# Patient Record
Sex: Female | Born: 1965 | Race: Black or African American | Hispanic: No | Marital: Single | Smoking: Current every day smoker
Health system: Southern US, Community
[De-identification: ages and names within clinical notes are randomized; demographics above are authoritative.]

## PROBLEM LIST (undated history)

## (undated) DIAGNOSIS — F32A Depression, unspecified: Secondary | ICD-10-CM

## (undated) DIAGNOSIS — F329 Major depressive disorder, single episode, unspecified: Secondary | ICD-10-CM

## (undated) DIAGNOSIS — F419 Anxiety disorder, unspecified: Secondary | ICD-10-CM

## (undated) DIAGNOSIS — N912 Amenorrhea, unspecified: Secondary | ICD-10-CM

## (undated) HISTORY — DX: Anxiety disorder, unspecified: F41.9

## (undated) HISTORY — DX: Major depressive disorder, single episode, unspecified: F32.9

## (undated) HISTORY — DX: Depression, unspecified: F32.A

## (undated) HISTORY — DX: Amenorrhea, unspecified: N91.2

---

## 1993-02-09 HISTORY — PX: OTHER SURGICAL HISTORY: SHX169

## 1994-02-09 HISTORY — PX: HAND SURGERY: SHX662

## 2012-11-30 ENCOUNTER — Encounter: Payer: Self-pay | Admitting: Obstetrics & Gynecology

## 2012-11-30 ENCOUNTER — Other Ambulatory Visit (HOSPITAL_COMMUNITY)
Admission: RE | Admit: 2012-11-30 | Discharge: 2012-11-30 | Disposition: A | Discharge status: Home / Self Care | Payer: Medicaid Other | Source: Ambulatory Visit | Attending: Obstetrics & Gynecology | Admitting: Obstetrics & Gynecology

## 2012-11-30 ENCOUNTER — Ambulatory Visit (INDEPENDENT_AMBULATORY_CARE_PROVIDER_SITE_OTHER): Payer: Medicaid Other | Admitting: Obstetrics & Gynecology

## 2012-11-30 VITALS — BP 117/71 | HR 59 | Temp 96.5°F | Resp 20 | Ht 64.0 in | Wt 160.2 lb

## 2012-11-30 DIAGNOSIS — Z23 Encounter for immunization: Secondary | ICD-10-CM

## 2012-11-30 DIAGNOSIS — Z1151 Encounter for screening for human papillomavirus (HPV): Secondary | ICD-10-CM | POA: Insufficient documentation

## 2012-11-30 DIAGNOSIS — Z01419 Encounter for gynecological examination (general) (routine) without abnormal findings: Secondary | ICD-10-CM

## 2012-11-30 DIAGNOSIS — Z1231 Encounter for screening mammogram for malignant neoplasm of breast: Secondary | ICD-10-CM

## 2012-11-30 DIAGNOSIS — R32 Unspecified urinary incontinence: Secondary | ICD-10-CM

## 2012-11-30 NOTE — Progress Notes (Signed)
  Subjective:     Jenna [name] is a 47 y.o. G0 PMP female and is here for a comprehensive gynecologic physical exam. The patient reports no problems. Underwent menopause at age 89, no PMB. Not sexually active. No other GYN concerns.  Patient reports incontinence for the past six years.  Wears incontinence pads. Always feel the urge to go to the bathroom. No leaking with Valsalva, coughing, sneezing.    History   Social History  . Marital Status: Single    Spouse Name: N/A    Number of Children: N/A  . Years of Education: N/A   Occupational History  . Not on file.   Social History Main Topics  . Smoking status: Current Every Day Smoker -- 0.25 packs/day for 25 years  . Smokeless tobacco: Not on file  . Alcohol Use: 1.2 oz/week    2 Cans of beer per week  . Drug Use: No  . Sexual Activity: No   Other Topics Concern  . Not on file   Social History Narrative  . No narrative on file   No health maintenance topics applied.  The following portions of the patient's history were reviewed and updated as appropriate: allergies, current medications, past family history, past medical history, past social history, past surgical history and problem list.  Last mammogram was 2007.  Review of Systems A comprehensive review of systems was negative.   Objective:   BP 117/71  Pulse 59  Temp(Src) 96.5 F (35.8 C)  Resp 20  Ht 5\' 4"  (1.626 m)  Wt 160 lb 3.2 oz (72.666 kg)  BMI 27.48 kg/m2 GENERAL: Well-developed, well-nourished female in no acute distress.  HEENT: Normocephalic, atraumatic. Sclerae anicteric.  NECK: Supple. Normal thyroid.  LUNGS: Clear to auscultation bilaterally.  HEART: Regular rate and rhythm. BREASTS: Symmetric in size. No masses, skin changes, nipple drainage, or lymphadenopathy. ABDOMEN: Soft, nontender, nondistended. No organomegaly. PELVIC: Normal external female genitalia. Vagina is pink and rugated.  Normal discharge. Normal cervix contour. Pap smear  obtained. Uterus is normal in size. No adnexal mass or tenderness.  EXTREMITIES: No cyanosis, clubbing, or edema, 2+ distal pulses.   Assessment:   Healthy female exam.  Incontinence, likely urge Desires flu vaccine   Plan:  Pap done, will follow up results and manage accordingly.  Flu vaccine given. Declines trial of Ditropan or referral to Urology. Basic information given to help with incontinence Mammogram scheduled Routine preventative health maintenance measures emphasized.  Jaynie Collins, MD, FACOG Attending Obstetrician & Gynecologist Faculty Practice, [facility]

## 2012-11-30 NOTE — Patient Instructions (Addendum)
Urinary Incontinence Your doctor wants you to have this information about urinary incontinence. This is the inability to keep urine in your body until you decide to release it. CAUSES  Prostate gland enlargement is a common cause of urinary incontinence. But there are many different causes for losing urinary control. They include:  Medicines.  Infections.  Prostate problems.  Surgery.  Neurological diseases.  Emotional factors. DIAGNOSIS  Evaluating the cause of incontinence is important in choosing the best treatment. This may require:  An ultrasound exam.  Kidney and bladder X-rays.  Cystoscopy. This is an exam of the bladder using a narrow scope. TREATMENT  For incontinent patients, normal daily hygiene and using changing pads or adult diapers regularly will prevent offensive odors and skin damage from the moisture. Changing your medicines may help control incontinence. Your caregiver may prescribe some medicines to help you regain control. Avoid caffeine. It can over-stimulate the bladder. Use the bathroom regularly. Try about every 2 to 3 hours even if you do not feel the need. Take time to empty your bladder completely. After urinating, wait a minute. Then try to urinate again. External devices used to catch urine or an indwelling urine catheter (Foley catheter) may be needed as well. Some prostate gland problems require surgery to correct. Call your caregiver for more information. Document Released: 03/05/2004 Document Revised: 04/20/2011 Document Reviewed: 02/29/2008 [facility] Patient Information 2014 [place], [place].  Preventive Care for Adults, Female A healthy lifestyle and preventive care can promote health and wellness. Preventive health guidelines for women include the following key practices.  A routine yearly physical is a good way to check with your caregiver about your health and preventive screening. It is a chance to share any concerns and updates on your health,  and to receive a thorough exam.  Visit your dentist for a routine exam and preventive care every 6 months. Brush your teeth twice a day and floss once a day. Good oral hygiene prevents tooth decay and gum disease.  The frequency of eye exams is based on your age, health, family medical history, use of contact lenses, and other factors. Follow your caregiver's recommendations for frequency of eye exams.  Eat a healthy diet. Foods like vegetables, fruits, whole grains, low-fat dairy products, and lean protein foods contain the nutrients you need without too many calories. Decrease your intake of foods high in solid fats, added sugars, and salt. Eat the right amount of calories for you.Get information about a proper diet from your caregiver, if necessary.  Regular physical exercise is one of the most important things you can do for your health. Most adults should get at least 150 minutes of moderate-intensity exercise (any activity that increases your heart rate and causes you to sweat) each week. In addition, most adults need muscle-strengthening exercises on 2 or more days a week.  Maintain a healthy weight. The body mass index (BMI) is a screening tool to identify possible weight problems. It provides an estimate of body fat based on height and weight. Your caregiver can help determine your BMI, and can help you achieve or maintain a healthy weight.For adults 20 years and older:  A BMI below 18.5 is considered underweight.  A BMI of 18.5 to 24.9 is normal.  A BMI of 25 to 29.9 is considered overweight.  A BMI of 30 and above is considered obese.  Maintain normal blood lipids and cholesterol levels by exercising and minimizing your intake of saturated fat. Eat a balanced diet with plenty  of fruit and vegetables. Blood tests for lipids and cholesterol should begin at age 91 and be repeated every 5 years. If your lipid or cholesterol levels are high, you are over 50, or you are at high risk for  heart disease, you may need your cholesterol levels checked more frequently.Ongoing high lipid and cholesterol levels should be treated with medicines if diet and exercise are not effective.  If you smoke, find out from your caregiver how to quit. If you do not use tobacco, do not start.  If you are pregnant, do not drink alcohol. If you are breastfeeding, be very cautious about drinking alcohol. If you are not pregnant and choose to drink alcohol, do not exceed 1 drink per day. One drink is considered to be 12 ounces (355 mL) of beer, 5 ounces (148 mL) of wine, or 1.5 ounces (44 mL) of liquor.  Avoid use of street drugs. Do not share needles with anyone. Ask for help if you need support or instructions about stopping the use of drugs.  High blood pressure causes heart disease and increases the risk of stroke. Your blood pressure should be checked at least every 1 to 2 years. Ongoing high blood pressure should be treated with medicines if weight loss and exercise are not effective.  If you are 66 to 47 years old, ask your caregiver if you should take aspirin to prevent strokes.  Diabetes screening involves taking a blood sample to check your fasting blood sugar level. This should be done once every 3 years, after age 46, if you are within normal weight and without risk factors for diabetes. Testing should be considered at a younger age or be carried out more frequently if you are overweight and have at least 1 risk factor for diabetes.  Breast cancer screening is essential preventive care for women. You should practice "breast self-awareness." This means understanding the normal appearance and feel of your breasts and may include breast self-examination. Any changes detected, no matter how small, should be reported to a caregiver. Women in their 28s and 30s should have a clinical breast exam (CBE) by a caregiver as part of a regular health exam every 1 to 3 years. After age 88, women should have a CBE  every year. Starting at age 64, women should consider having a mammography (breast X-ray test) every year. Women who have a family history of breast cancer should talk to their caregiver about genetic screening. Women at a high risk of breast cancer should talk to their caregivers about having magnetic resonance imaging (MRI) and a mammography every year.  The Pap test is a screening test for cervical cancer. A Pap test can show cell changes on the cervix that might become cervical cancer if left untreated. A Pap test is a procedure in which cells are obtained and examined from the lower end of the uterus (cervix).  Women should have a Pap test starting at age 38.  Between ages 108 and 107, Pap tests should be repeated every 2 years.  Beginning at age 14, you should have a Pap test every 3 years as long as the past 3 Pap tests have been normal.  Some women have medical problems that increase the chance of getting cervical cancer. Talk to your caregiver about these problems. It is especially important to talk to your caregiver if a new problem develops soon after your last Pap test. In these cases, your caregiver may recommend more frequent screening and Pap tests.  The above recommendations are the same for women who have or have not gotten the vaccine for human papillomavirus (HPV).  If you had a hysterectomy for a problem that was not cancer or a condition that could lead to cancer, then you no longer need Pap tests. Even if you no longer need a Pap test, a regular exam is a good idea to make sure no other problems are starting.  If you are between ages 77 and 50, and you have had normal Pap tests going back 10 years, you no longer need Pap tests. Even if you no longer need a Pap test, a regular exam is a good idea to make sure no other problems are starting.  If you have had past treatment for cervical cancer or a condition that could lead to cancer, you need Pap tests and screening for cancer for  at least 20 years after your treatment.  If Pap tests have been discontinued, risk factors (such as a new sexual partner) need to be reassessed to determine if screening should be resumed.  The HPV test is an additional test that may be used for cervical cancer screening. The HPV test looks for the virus that can cause the cell changes on the cervix. The cells collected during the Pap test can be tested for HPV. The HPV test could be used to screen women aged 44 years and older, and should be used in women of any age who have unclear Pap test results. After the age of 38, women should have HPV testing at the same frequency as a Pap test.  Colorectal cancer can be detected and often prevented. Most routine colorectal cancer screening begins at the age of 43 and continues through age 66. However, your caregiver may recommend screening at an earlier age if you have risk factors for colon cancer. On a yearly basis, your caregiver may provide home test kits to check for hidden blood in the stool. Use of a small camera at the end of a tube, to directly examine the colon (sigmoidoscopy or colonoscopy), can detect the earliest forms of colorectal cancer. Talk to your caregiver about this at age 77, when routine screening begins. Direct examination of the colon should be repeated every 5 to 10 years through age 34, unless early forms of pre-cancerous polyps or small growths are found.  Hepatitis C blood testing is recommended for all people born from [birth year] through 1965 and any individual with known risks for hepatitis C.  Practice safe sex. Use condoms and avoid high-risk sexual practices to reduce the spread of sexually transmitted infections (STIs). STIs include gonorrhea, chlamydia, syphilis, trichomonas, herpes, HPV, and human immunodeficiency virus (HIV). Herpes, HIV, and HPV are viral illnesses that have no cure. They can result in disability, cancer, and death. Sexually active women aged 55 and younger  should be checked for chlamydia. Older women with new or multiple partners should also be tested for chlamydia. Testing for other STIs is recommended if you are sexually active and at increased risk.  Osteoporosis is a disease in which the bones lose minerals and strength with aging. This can result in serious bone fractures. The risk of osteoporosis can be identified using a bone density scan. Women ages 70 and over and women at risk for fractures or osteoporosis should discuss screening with their caregivers. Ask your caregiver whether you should take a calcium supplement or vitamin D to reduce the rate of osteoporosis.  Menopause can be associated with physical symptoms  and risks. Hormone replacement therapy is available to decrease symptoms and risks. You should talk to your caregiver about whether hormone replacement therapy is right for you.  Use sunscreen with sun protection factor (SPF) of 30 or more. Apply sunscreen liberally and repeatedly throughout the day. You should seek shade when your shadow is shorter than you. Protect yourself by wearing long sleeves, pants, a wide-brimmed hat, and sunglasses year round, whenever you are outdoors.  Once a month, do a whole body skin exam, using a mirror to look at the skin on your back. Notify your caregiver of new moles, moles that have irregular borders, moles that are larger than a pencil eraser, or moles that have changed in shape or color.  Stay current with required immunizations.  Influenza. You need a dose every fall (or winter). The composition of the flu vaccine changes each year, so being vaccinated once is not enough.  Pneumococcal polysaccharide. You need 1 to 2 doses if you smoke cigarettes or if you have certain chronic medical conditions. You need 1 dose at age 65 (or older) if you have never been vaccinated.  Tetanus, diphtheria, pertussis (Tdap, Td). Get 1 dose of Tdap vaccine if you are younger than age 47, are over 69 and have  contact with an infant, are a Research scientist (physical sciences), are pregnant, or simply want to be protected from whooping cough. After that, you need a Td booster dose every 10 years. Consult your caregiver if you have not had at least 3 tetanus and diphtheria-containing shots sometime in your life or have a deep or dirty wound.  HPV. You need this vaccine if you are a woman age 25 or younger. The vaccine is given in 3 doses over 6 months.  Measles, mumps, rubella (MMR). You need at least 1 dose of MMR if you were born in 1957 or later. You may also need a second dose.  Meningococcal. If you are age 65 to 27 and a first-year college student living in a residence hall, or have one of several medical conditions, you need to get vaccinated against meningococcal disease. You may also need additional booster doses.  Zoster (shingles). If you are age 76 or older, you should get this vaccine.  Varicella (chickenpox). If you have never had chickenpox or you were vaccinated but received only 1 dose, talk to your caregiver to find out if you need this vaccine.  Hepatitis A. You need this vaccine if you have a specific risk factor for hepatitis A virus infection or you simply wish to be protected from this disease. The vaccine is usually given as 2 doses, 6 to 18 months apart.  Hepatitis B. You need this vaccine if you have a specific risk factor for hepatitis B virus infection or you simply wish to be protected from this disease. The vaccine is given in 3 doses, usually over 6 months. Preventive Services / Frequency Ages 14 to 95  Blood pressure check.** / Every 1 to 2 years.  Lipid and cholesterol check.** / Every 5 years beginning at age 7.  Clinical breast exam.** / Every 3 years for women in their 34s and 30s.  Pap test.** / Every 2 years from ages 63 through 75. Every 3 years starting at age 44 through age 54 or 55 with a history of 3 consecutive normal Pap tests.  HPV screening.** / Every 3 years from ages  27 through ages 68 to 21 with a history of 3 consecutive normal Pap tests.  Hepatitis C blood test.** / For any individual with known risks for hepatitis C.  Skin self-exam. / Monthly.  Influenza immunization.** / Every year.  Pneumococcal polysaccharide immunization.** / 1 to 2 doses if you smoke cigarettes or if you have certain chronic medical conditions.  Tetanus, diphtheria, pertussis (Tdap, Td) immunization. / A one-time dose of Tdap vaccine. After that, you need a Td booster dose every 10 years.  HPV immunization. / 3 doses over 6 months, if you are 30 and younger.  Measles, mumps, rubella (MMR) immunization. / You need at least 1 dose of MMR if you were born in 1957 or later. You may also need a second dose.  Meningococcal immunization. / 1 dose if you are age 12 to 33 and a first-year college student living in a residence hall, or have one of several medical conditions, you need to get vaccinated against meningococcal disease. You may also need additional booster doses.  Varicella immunization.** / Consult your caregiver.  Hepatitis A immunization.** / Consult your caregiver. 2 doses, 6 to 18 months apart.  Hepatitis B immunization.** / Consult your caregiver. 3 doses usually over 6 months. Ages 41 to 22  Blood pressure check.** / Every 1 to 2 years.  Lipid and cholesterol check.** / Every 5 years beginning at age 26.  Clinical breast exam.** / Every year after age 22.  Mammogram.** / Every year beginning at age 72 and continuing for as long as you are in good health. Consult with your caregiver.  Pap test.** / Every 3 years starting at age 58 through age 60 or 46 with a history of 3 consecutive normal Pap tests.  HPV screening.** / Every 3 years from ages 55 through ages 74 to 51 with a history of 3 consecutive normal Pap tests.  Fecal occult blood test (FOBT) of stool. / Every year beginning at age 109 and continuing until age 52. You may not need to do this test if you  get a colonoscopy every 10 years.  Flexible sigmoidoscopy or colonoscopy.** / Every 5 years for a flexible sigmoidoscopy or every 10 years for a colonoscopy beginning at age 41 and continuing until age 29.  Hepatitis C blood test.** / For all people born from [birth year] through 1965 and any individual with known risks for hepatitis C.  Skin self-exam. / Monthly.  Influenza immunization.** / Every year.  Pneumococcal polysaccharide immunization.** / 1 to 2 doses if you smoke cigarettes or if you have certain chronic medical conditions.  Tetanus, diphtheria, pertussis (Tdap, Td) immunization.** / A one-time dose of Tdap vaccine. After that, you need a Td booster dose every 10 years.  Measles, mumps, rubella (MMR) immunization. / You need at least 1 dose of MMR if you were born in 1957 or later. You may also need a second dose.  Varicella immunization.** / Consult your caregiver.  Meningococcal immunization.** / Consult your caregiver.  Hepatitis A immunization.** / Consult your caregiver. 2 doses, 6 to 18 months apart.  Hepatitis B immunization.** / Consult your caregiver. 3 doses, usually over 6 months. Ages 13 and over  Blood pressure check.** / Every 1 to 2 years.  Lipid and cholesterol check.** / Every 5 years beginning at age 73.  Clinical breast exam.** / Every year after age 71.  Mammogram.** / Every year beginning at age 46 and continuing for as long as you are in good health. Consult with your caregiver.  Pap test.** / Every 3 years starting at age 52 through age  65 or 70 with a 3 consecutive normal Pap tests. Testing can be stopped between 65 and 70 with 3 consecutive normal Pap tests and no abnormal Pap or HPV tests in the past 10 years.  HPV screening.** / Every 3 years from ages 9 through ages 48 or 12 with a history of 3 consecutive normal Pap tests. Testing can be stopped between 65 and 70 with 3 consecutive normal Pap tests and no abnormal Pap or HPV tests in the past 10  years.  Fecal occult blood test (FOBT) of stool. / Every year beginning at age 51 and continuing until age 27. You may not need to do this test if you get a colonoscopy every 10 years.  Flexible sigmoidoscopy or colonoscopy.** / Every 5 years for a flexible sigmoidoscopy or every 10 years for a colonoscopy beginning at age 35 and continuing until age 45.  Hepatitis C blood test.** / For all people born from [birth year] through 1965 and any individual with known risks for hepatitis C.  Osteoporosis screening.** / A one-time screening for women ages 55 and over and women at risk for fractures or osteoporosis.  Skin self-exam. / Monthly.  Influenza immunization.** / Every year.  Pneumococcal polysaccharide immunization.** / 1 dose at age 72 (or older) if you have never been vaccinated.  Tetanus, diphtheria, pertussis (Tdap, Td) immunization. / A one-time dose of Tdap vaccine if you are over 65 and have contact with an infant, are a Research scientist (physical sciences), or simply want to be protected from whooping cough. After that, you need a Td booster dose every 10 years.  Varicella immunization.** / Consult your caregiver.  Meningococcal immunization.** / Consult your caregiver.  Hepatitis A immunization.** / Consult your caregiver. 2 doses, 6 to 18 months apart.  Hepatitis B immunization.** / Check with your caregiver. 3 doses, usually over 6 months. ** Family history and personal history of risk and conditions may change your caregiver's recommendations. Document Released: 03/24/2001 Document Revised: 04/20/2011 Document Reviewed: 06/23/2010 [facility] Patient Information 2014 [place], [place].  Thank you for enrolling in MyChart. Please follow the instructions below to securely access your online medical record. MyChart allows you to send messages to your doctor, view your test results, manage appointments, and more.   How Do I Sign Up? 1. In your Internet browser, go to [employer] and enter  [email or web address]. 2. Click on the Sign Up Now link in the Sign In box. You will see the New Member Sign Up page. 3. Enter your MyChart Access Code exactly as it appears below. You will not need to use this code after you've completed the sign-up process. If you do not sign up before the expiration date, you must request a new code.  MyChart Access Code: [email or web address] Expires: 01/29/2013  3:20 PM  4. Enter your Social Security Number ([id number]) and Date of Birth (mm/dd/yyyy) as indicated and click Submit. You will be taken to the next sign-up page. 5. Create a MyChart ID. This will be your MyChart login ID and cannot be changed, so think of one that is secure and easy to remember. 6. Create a MyChart password. You can change your password at any time. 7. Enter your Password Reset Question and Answer. This can be used at a later time if you forget your password.  8. Enter your e-mail address. You will receive e-mail notification when new information is available in MyChart. 9. Click Sign Up. You can now view your medical record.   Additional Information Remember,  MyChart is NOT to be used for urgent needs. For medical emergencies, dial 911.

## 2012-11-30 NOTE — Progress Notes (Signed)
Pt here for annual Pap- does not remember when last Pap done, ?? 2007,  She states she has not had menstrual period x6 years

## 2012-12-01 ENCOUNTER — Encounter: Payer: Self-pay | Admitting: *Deleted

## 2012-12-13 ENCOUNTER — Ambulatory Visit (HOSPITAL_COMMUNITY)
Admission: RE | Admit: 2012-12-13 | Discharge: 2012-12-13 | Disposition: A | Discharge status: Home / Self Care | Payer: Medicaid Other | Source: Ambulatory Visit | Attending: Obstetrics & Gynecology | Admitting: Obstetrics & Gynecology

## 2012-12-13 DIAGNOSIS — Z01419 Encounter for gynecological examination (general) (routine) without abnormal findings: Secondary | ICD-10-CM

## 2012-12-13 DIAGNOSIS — Z1231 Encounter for screening mammogram for malignant neoplasm of breast: Secondary | ICD-10-CM

## 2012-12-14 ENCOUNTER — Telehealth: Payer: Self-pay | Admitting: *Deleted

## 2012-12-14 DIAGNOSIS — N6320 Unspecified lump in the left breast, unspecified quadrant: Secondary | ICD-10-CM

## 2012-12-14 NOTE — Telephone Encounter (Signed)
Message copied by Dorothyann Peng on Wed Dec 14, 2012  2:33 PM ------      Message from: Jenna [name] A      Created: Wed Dec 14, 2012  1:03 PM       Please call patient to ensure she has follow up for this possible left breast mass; no follow up plans were specified in the results.  She needs diagnostic mammogram and possibly ultrasound of the left breast.       ------

## 2012-12-14 NOTE — Telephone Encounter (Signed)
Called pt, voicemail, left message for pt to call clinic.

## 2012-12-15 ENCOUNTER — Other Ambulatory Visit: Payer: Self-pay | Admitting: Obstetrics & Gynecology

## 2012-12-15 DIAGNOSIS — N6320 Unspecified lump in the left breast, unspecified quadrant: Secondary | ICD-10-CM

## 2012-12-15 NOTE — Telephone Encounter (Signed)
Spoke with patient about need for additional testing, number to breast center given.

## 2012-12-15 NOTE — Telephone Encounter (Signed)
Pt called nurse line for results. 

## 2012-12-15 NOTE — Telephone Encounter (Signed)
Left message on sister's voicemail for pt to call clinic.

## 2013-01-04 ENCOUNTER — Other Ambulatory Visit: Payer: Self-pay | Admitting: Obstetrics & Gynecology

## 2013-01-04 ENCOUNTER — Ambulatory Visit
Admission: RE | Admit: 2013-01-04 | Discharge: 2013-01-04 | Disposition: A | Discharge status: Home / Self Care | Payer: Medicaid Other | Source: Ambulatory Visit | Attending: Obstetrics & Gynecology | Admitting: Obstetrics & Gynecology

## 2013-01-04 DIAGNOSIS — N6320 Unspecified lump in the left breast, unspecified quadrant: Secondary | ICD-10-CM

## 2013-01-09 ENCOUNTER — Ambulatory Visit: Admission: RE | Admit: 2013-01-09 | Payer: Medicaid Other | Source: Ambulatory Visit

## 2013-01-09 ENCOUNTER — Inpatient Hospital Stay: Admission: RE | Admit: 2013-01-09 | Payer: Medicaid Other | Source: Ambulatory Visit

## 2013-02-27 ENCOUNTER — Ambulatory Visit
Admission: RE | Admit: 2013-02-27 | Discharge: 2013-02-27 | Disposition: A | Discharge status: Home / Self Care | Payer: Medicaid Other | Source: Ambulatory Visit

## 2013-02-27 ENCOUNTER — Other Ambulatory Visit: Payer: Self-pay | Admitting: Obstetrics & Gynecology

## 2013-02-27 ENCOUNTER — Ambulatory Visit: Admission: RE | Admit: 2013-02-27 | Payer: Medicaid Other | Source: Ambulatory Visit

## 2013-02-27 DIAGNOSIS — N6320 Unspecified lump in the left breast, unspecified quadrant: Secondary | ICD-10-CM

## 2013-03-13 ENCOUNTER — Ambulatory Visit (INDEPENDENT_AMBULATORY_CARE_PROVIDER_SITE_OTHER): Payer: Medicaid Other | Admitting: General Surgery

## 2013-05-17 ENCOUNTER — Ambulatory Visit: Payer: Medicaid Other | Attending: Internal Medicine | Admitting: Internal Medicine

## 2013-05-17 VITALS — BP 109/70 | HR 62 | Temp 97.5°F | Resp 16 | Ht 65.0 in | Wt 163.0 lb

## 2013-05-17 DIAGNOSIS — I10 Essential (primary) hypertension: Secondary | ICD-10-CM | POA: Insufficient documentation

## 2013-05-17 LAB — CBC WITH DIFFERENTIAL/PLATELET
BASOS ABS: 0 10*3/uL (ref 0.0–0.1)
BASOS PCT: 0 % (ref 0–1)
Eosinophils Absolute: 0.2 10*3/uL (ref 0.0–0.7)
Eosinophils Relative: 2 % (ref 0–5)
HCT: 39.3 % (ref 36.0–46.0)
Hemoglobin: 12.3 g/dL (ref 12.0–15.0)
Lymphocytes Relative: 42 % (ref 12–46)
Lymphs Abs: 4.1 10*3/uL — ABNORMAL HIGH (ref 0.7–4.0)
MCH: 22 pg — ABNORMAL LOW (ref 26.0–34.0)
MCHC: 31.3 g/dL (ref 30.0–36.0)
MCV: 70.3 fL — AB (ref 78.0–100.0)
MONO ABS: 0.7 10*3/uL (ref 0.1–1.0)
Monocytes Relative: 7 % (ref 3–12)
Neutro Abs: 4.8 10*3/uL (ref 1.7–7.7)
Neutrophils Relative %: 49 % (ref 43–77)
Platelets: 296 10*3/uL (ref 150–400)
RBC: 5.59 MIL/uL — ABNORMAL HIGH (ref 3.87–5.11)
RDW: 15.5 % (ref 11.5–15.5)
WBC: 9.7 10*3/uL (ref 4.0–10.5)

## 2013-05-17 LAB — COMPREHENSIVE METABOLIC PANEL
ALK PHOS: 72 U/L (ref 39–117)
ALT: 15 U/L (ref 0–35)
AST: 14 U/L (ref 0–37)
Albumin: 3.9 g/dL (ref 3.5–5.2)
BILIRUBIN TOTAL: 0.4 mg/dL (ref 0.2–1.2)
BUN: 13 mg/dL (ref 6–23)
CO2: 27 mEq/L (ref 19–32)
Calcium: 9.8 mg/dL (ref 8.4–10.5)
Chloride: 104 mEq/L (ref 96–112)
Creat: 0.78 mg/dL (ref 0.50–1.10)
Glucose, Bld: 85 mg/dL (ref 70–99)
Potassium: 4.7 mEq/L (ref 3.5–5.3)
Sodium: 139 mEq/L (ref 135–145)
Total Protein: 6.5 g/dL (ref 6.0–8.3)

## 2013-05-17 LAB — LIPID PANEL
CHOL/HDL RATIO: 3.4 ratio
Cholesterol: 139 mg/dL (ref 0–200)
HDL: 41 mg/dL (ref >39)
LDL CALC: 77 mg/dL (ref 0–99)
Triglycerides: 103 mg/dL (ref <150)
VLDL: 21 mg/dL (ref 0–40)

## 2013-05-17 LAB — HEMOGLOBIN A1C
HEMOGLOBIN A1C: 6.2 % — AB (ref <5.7)
Mean Plasma Glucose: 131 mg/dL — ABNORMAL HIGH (ref <117)

## 2013-05-17 NOTE — Progress Notes (Signed)
Patient ID: Jenna [name], female   DOB: August 12, 1965, 48 y.o.   MRN: [id number]   CC: follow up  HPI: Pt is 48 yo female who presents to clinic for follow up. She denies chest pain, shortness of breath, no abdominal or urinary concerns.   Allergies  Allergen Reactions  . Penicillins Rash  . Sulfa Antibiotics Rash   Past Medical History  Diagnosis Date  . Depression   . Anxiety   . Amenorrhea    Current Outpatient Prescriptions on File Prior to Visit  Medication Sig Dispense Refill  . ARIPiprazole (ABILIFY) 5 MG tablet Take 5 mg by mouth daily.      Marland Kitchen FLUoxetine (PROZAC) 20 MG tablet Take 20 mg by mouth daily.       No current facility-administered medications on file prior to visit.   Family History  Problem Relation Age of Onset  . Cancer Mother     breast  . Stroke Mother   . Depression Mother    History   Social History  . Marital Status: Single    Spouse Name: N/A    Number of Children: N/A  . Years of Education: N/A   Occupational History  . Not on file.   Social History Main Topics  . Smoking status: Current Every Day Smoker -- 0.25 packs/day for 25 years  . Smokeless tobacco: Not on file  . Alcohol Use: 1.2 oz/week    2 Cans of beer per week  . Drug Use: No  . Sexual Activity: No   Other Topics Concern  . Not on file   Social History Narrative  . No narrative on file    Review of Systems  Constitutional: Negative for fever, chills, diaphoresis, activity change, appetite change and fatigue.  HENT: Negative for ear pain, nosebleeds, congestion, facial swelling, rhinorrhea, neck pain, neck stiffness and ear discharge.   Eyes: Negative for pain, discharge, redness, itching and visual disturbance.  Respiratory: Negative for cough, choking, chest tightness, shortness of breath, wheezing and stridor.   Cardiovascular: Negative for chest pain, palpitations and leg swelling.  Gastrointestinal: Negative for abdominal distention.  Genitourinary: Negative  for dysuria, urgency, frequency, hematuria, flank pain, decreased urine volume, difficulty urinating and dyspareunia.  Musculoskeletal: Negative for back pain, joint swelling, arthralgias and gait problem.  Neurological: Negative for dizziness, tremors, seizures, syncope, facial asymmetry, speech difficulty, weakness, light-headedness, numbness and headaches.  Hematological: Negative for adenopathy. Does not bruise/bleed easily.  Psychiatric/Behavioral: Negative for hallucinations, behavioral problems, confusion, dysphoric mood, decreased concentration and agitation.    Objective:   Filed Vitals:   05/17/13 0910  BP: 109/70  Pulse: 62  Temp: 97.5 F (36.4 C)  Resp: 16    Physical Exam  Constitutional: Appears well-developed and well-nourished. No distress.  HENT: Normocephalic. External right and left ear normal. Oropharynx is clear and moist.  Eyes: Conjunctivae and EOM are normal. PERRLA, no scleral icterus.  Neck: Normal ROM. Neck supple. No JVD. No tracheal deviation. No thyromegaly.  CVS: RRR, S1/S2 +, no murmurs, no gallops, no carotid bruit.  Pulmonary: Effort and breath sounds normal, no stridor, rhonchi, wheezes, rales.  Abdominal: Soft. BS +,  no distension, tenderness, rebound or guarding.  Musculoskeletal: Normal range of motion. No edema and no tenderness.  Lymphadenopathy: No lymphadenopathy noted, cervical, inguinal. Neuro: Alert. Normal reflexes, muscle tone coordination. No cranial nerve deficit. Skin: Skin is warm and dry. No rash noted. Not diaphoretic. No erythema. No pallor.  Psychiatric: Normal mood and affect. Behavior, judgment,  thought content normal.   No results found for this basename: WBC, HGB, HCT, MCV, PLT   No results found for this basename: CREATININE, BUN, NA, K, CL, CO2    No results found for this basename: HGBA1C   Lipid Panel  No results found for this basename: chol, trig, hdl, cholhdl, vldl, ldlcalc       Assessment and plan:    Regular physical exam - no concerns today - pt on Prozac and abilify  - will check CBC and CMET today - will also check lipid panel and A1C - provided dietary recommendations and emphasized regular exercising

## 2013-05-17 NOTE — Progress Notes (Signed)
Pt is here to establish care. Pt is requesting a physical. Pt reports that she injured her right shoulder.

## 2013-05-18 LAB — TSH: TSH: 2.264 u[IU]/mL (ref 0.350–4.500)

## 2019-05-19 ENCOUNTER — Other Ambulatory Visit: Payer: Self-pay

## 2019-05-19 ENCOUNTER — Emergency Department (HOSPITAL_COMMUNITY)
Admission: EM | Admit: 2019-05-19 | Discharge: 2019-05-19 | Disposition: A | Discharge status: Home / Self Care | Payer: Medicaid Other | Attending: Emergency Medicine | Admitting: Emergency Medicine

## 2019-05-19 ENCOUNTER — Emergency Department (HOSPITAL_COMMUNITY): Payer: Medicaid Other

## 2019-05-19 ENCOUNTER — Encounter (HOSPITAL_COMMUNITY): Payer: Self-pay

## 2019-05-19 DIAGNOSIS — Y92410 Unspecified street and highway as the place of occurrence of the external cause: Secondary | ICD-10-CM | POA: Insufficient documentation

## 2019-05-19 DIAGNOSIS — S51851A Open bite of right forearm, initial encounter: Secondary | ICD-10-CM

## 2019-05-19 DIAGNOSIS — S50871A Other superficial bite of right forearm, initial encounter: Secondary | ICD-10-CM | POA: Insufficient documentation

## 2019-05-19 DIAGNOSIS — Z23 Encounter for immunization: Secondary | ICD-10-CM | POA: Insufficient documentation

## 2019-05-19 DIAGNOSIS — Y999 Unspecified external cause status: Secondary | ICD-10-CM | POA: Insufficient documentation

## 2019-05-19 DIAGNOSIS — Y9301 Activity, walking, marching and hiking: Secondary | ICD-10-CM | POA: Insufficient documentation

## 2019-05-19 DIAGNOSIS — F17210 Nicotine dependence, cigarettes, uncomplicated: Secondary | ICD-10-CM | POA: Insufficient documentation

## 2019-05-19 DIAGNOSIS — W540XXA Bitten by dog, initial encounter: Secondary | ICD-10-CM | POA: Insufficient documentation

## 2019-05-19 MED ORDER — CLINDAMYCIN HCL 150 MG PO CAPS
450.0000 mg | ORAL_CAPSULE | Freq: Once | ORAL | Status: AC
  Administered 2019-05-19: 17:00:00 450 mg via ORAL
  Filled 2019-05-19: qty 3

## 2019-05-19 MED ORDER — CLINDAMYCIN HCL 150 MG PO CAPS: 450.0000 mg | ORAL_CAPSULE | Freq: Three times a day (TID) | ORAL | 0 refills | Status: AC

## 2019-05-19 MED ORDER — DOXYCYCLINE HYCLATE 100 MG PO TABS
100.0000 mg | ORAL_TABLET | Freq: Once | ORAL | Status: AC
  Administered 2019-05-19: 17:00:00 100 mg via ORAL
  Filled 2019-05-19: qty 1

## 2019-05-19 MED ORDER — BACITRACIN ZINC 500 UNIT/GM EX OINT
TOPICAL_OINTMENT | Freq: Two times a day (BID) | CUTANEOUS | Status: DC
  Administered 2019-05-19: 1 via TOPICAL
  Filled 2019-05-19: qty 1.8

## 2019-05-19 MED ORDER — TETANUS-DIPHTH-ACELL PERTUSSIS 5-2.5-18.5 LF-MCG/0.5 IM SUSP
0.5000 mL | Freq: Once | INTRAMUSCULAR | Status: AC
  Administered 2019-05-19: 17:00:00 0.5 mL via INTRAMUSCULAR
  Filled 2019-05-19: qty 0.5

## 2019-05-19 MED ORDER — DOXYCYCLINE HYCLATE 100 MG PO CAPS: 100.0000 mg | ORAL_CAPSULE | Freq: Two times a day (BID) | ORAL | 0 refills | Status: AC

## 2019-05-19 NOTE — Discharge Instructions (Addendum)
Take antibiotics as prescribed and complete the full course.  You can apply antibiotic ointment to the area.  Recommend wound check with your doctor in 2 days, return to ER for any worsening or concerning symptoms. You should follow-up with animal control regarding the dog's rabies vaccine status.  If the dog is not vaccinated, the dog will need to be quarantined for 10 days for monitoring.  If animal control is not able to locate the dog and verify vaccine status or quarantine dog, you will need to get the rabies vaccine series, this can be done at the [facility] urgent care.
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
## 2019-05-19 NOTE — ED Triage Notes (Signed)
Pt bit by dog on right forearm, unknown rabies vaccination status. Pt has laceration to forearm with swelling noted.

## 2019-08-21 ENCOUNTER — Other Ambulatory Visit: Payer: Self-pay

## 2019-08-21 ENCOUNTER — Emergency Department (HOSPITAL_COMMUNITY): Payer: Self-pay

## 2019-08-21 ENCOUNTER — Inpatient Hospital Stay (HOSPITAL_COMMUNITY)
Admission: EM | Admit: 2019-08-21 | Discharge: 2019-08-29 | DRG: 871 | Disposition: A | Discharge status: Home / Self Care | Payer: Self-pay | Attending: Internal Medicine | Admitting: Internal Medicine

## 2019-08-21 ENCOUNTER — Encounter (HOSPITAL_COMMUNITY): Payer: Self-pay | Admitting: Family Medicine

## 2019-08-21 DIAGNOSIS — R471 Dysarthria and anarthria: Secondary | ICD-10-CM | POA: Diagnosis present

## 2019-08-21 DIAGNOSIS — E876 Hypokalemia: Secondary | ICD-10-CM | POA: Diagnosis present

## 2019-08-21 DIAGNOSIS — D494 Neoplasm of unspecified behavior of bladder: Secondary | ICD-10-CM | POA: Diagnosis present

## 2019-08-21 DIAGNOSIS — E871 Hypo-osmolality and hyponatremia: Secondary | ICD-10-CM | POA: Diagnosis present

## 2019-08-21 DIAGNOSIS — E872 Acidosis: Secondary | ICD-10-CM | POA: Diagnosis present

## 2019-08-21 DIAGNOSIS — E860 Dehydration: Secondary | ICD-10-CM | POA: Diagnosis present

## 2019-08-21 DIAGNOSIS — Z9049 Acquired absence of other specified parts of digestive tract: Secondary | ICD-10-CM

## 2019-08-21 DIAGNOSIS — F419 Anxiety disorder, unspecified: Secondary | ICD-10-CM | POA: Diagnosis present

## 2019-08-21 DIAGNOSIS — D696 Thrombocytopenia, unspecified: Secondary | ICD-10-CM | POA: Diagnosis present

## 2019-08-21 DIAGNOSIS — F329 Major depressive disorder, single episode, unspecified: Secondary | ICD-10-CM | POA: Diagnosis present

## 2019-08-21 DIAGNOSIS — I959 Hypotension, unspecified: Secondary | ICD-10-CM | POA: Diagnosis present

## 2019-08-21 DIAGNOSIS — N3289 Other specified disorders of bladder: Secondary | ICD-10-CM | POA: Diagnosis present

## 2019-08-21 DIAGNOSIS — D649 Anemia, unspecified: Secondary | ICD-10-CM | POA: Diagnosis present

## 2019-08-21 DIAGNOSIS — F17210 Nicotine dependence, cigarettes, uncomplicated: Secondary | ICD-10-CM | POA: Diagnosis present

## 2019-08-21 DIAGNOSIS — A419 Sepsis, unspecified organism: Principal | ICD-10-CM | POA: Diagnosis present

## 2019-08-21 DIAGNOSIS — Z818 Family history of other mental and behavioral disorders: Secondary | ICD-10-CM

## 2019-08-21 DIAGNOSIS — K8000 Calculus of gallbladder with acute cholecystitis without obstruction: Secondary | ICD-10-CM | POA: Diagnosis present

## 2019-08-21 DIAGNOSIS — Z882 Allergy status to sulfonamides status: Secondary | ICD-10-CM

## 2019-08-21 DIAGNOSIS — Z880 Allergy status to penicillin: Secondary | ICD-10-CM

## 2019-08-21 DIAGNOSIS — N170 Acute kidney failure with tubular necrosis: Secondary | ICD-10-CM | POA: Diagnosis present

## 2019-08-21 DIAGNOSIS — Z20822 Contact with and (suspected) exposure to covid-19: Secondary | ICD-10-CM | POA: Diagnosis present

## 2019-08-21 DIAGNOSIS — E8729 Other acidosis: Secondary | ICD-10-CM | POA: Diagnosis present

## 2019-08-21 DIAGNOSIS — G9349 Other encephalopathy: Secondary | ICD-10-CM | POA: Diagnosis present

## 2019-08-21 DIAGNOSIS — N179 Acute kidney failure, unspecified: Secondary | ICD-10-CM | POA: Diagnosis present

## 2019-08-21 DIAGNOSIS — G9341 Metabolic encephalopathy: Secondary | ICD-10-CM | POA: Diagnosis present

## 2019-08-21 LAB — RAPID URINE DRUG SCREEN, HOSP PERFORMED
Amphetamines: NOT DETECTED
Barbiturates: NOT DETECTED
Benzodiazepines: NOT DETECTED
Cocaine: NOT DETECTED
Opiates: NOT DETECTED
Tetrahydrocannabinol: NOT DETECTED

## 2019-08-21 LAB — CBC WITH DIFFERENTIAL/PLATELET
Abs Immature Granulocytes: 0.16 10*3/uL — ABNORMAL HIGH (ref 0.00–0.07)
Basophils Absolute: 0.1 10*3/uL (ref 0.0–0.1)
Basophils Relative: 1 %
Eosinophils Absolute: 0 10*3/uL (ref 0.0–0.5)
Eosinophils Relative: 0 %
HCT: 32 % — ABNORMAL LOW (ref 36.0–46.0)
Hemoglobin: 11 g/dL — ABNORMAL LOW (ref 12.0–15.0)
Immature Granulocytes: 1 %
Lymphocytes Relative: 9 %
Lymphs Abs: 1.1 10*3/uL (ref 0.7–4.0)
MCH: 22.4 pg — ABNORMAL LOW (ref 26.0–34.0)
MCHC: 34.4 g/dL (ref 30.0–36.0)
MCV: 65 fL — ABNORMAL LOW (ref 80.0–100.0)
Monocytes Absolute: 1.3 10*3/uL — ABNORMAL HIGH (ref 0.1–1.0)
Monocytes Relative: 10 %
Neutro Abs: 10.1 10*3/uL — ABNORMAL HIGH (ref 1.7–7.7)
Neutrophils Relative %: 79 %
Platelets: 115 10*3/uL — ABNORMAL LOW (ref 150–400)
RBC: 4.92 MIL/uL (ref 3.87–5.11)
RDW: 14.1 % (ref 11.5–15.5)
WBC: 12.7 10*3/uL — ABNORMAL HIGH (ref 4.0–10.5)
nRBC: 0 % (ref 0.0–0.2)

## 2019-08-21 LAB — IRON AND TIBC
Iron: 35 ug/dL (ref 28–170)
Saturation Ratios: 15 % (ref 10.4–31.8)
TIBC: 237 ug/dL — ABNORMAL LOW (ref 250–450)
UIBC: 202 ug/dL

## 2019-08-21 LAB — SARS CORONAVIRUS 2 BY RT PCR (HOSPITAL ORDER, PERFORMED IN ~~LOC~~ HOSPITAL LAB): SARS Coronavirus 2: NEGATIVE

## 2019-08-21 LAB — I-STAT BETA HCG BLOOD, ED (MC, WL, AP ONLY): I-stat hCG, quantitative: <5 m[IU]/mL (ref <5)

## 2019-08-21 LAB — PROTIME-INR
INR: 1.1 (ref 0.8–1.2)
Prothrombin Time: 13.5 seconds (ref 11.4–15.2)

## 2019-08-21 LAB — COMPREHENSIVE METABOLIC PANEL
ALT: 29 U/L (ref 0–44)
AST: 32 U/L (ref 15–41)
Albumin: 2.7 g/dL — ABNORMAL LOW (ref 3.5–5.0)
Alkaline Phosphatase: 104 U/L (ref 38–126)
Anion gap: 19 — ABNORMAL HIGH (ref 5–15)
BUN: 82 mg/dL — ABNORMAL HIGH (ref 6–20)
CO2: 20 mmol/L — ABNORMAL LOW (ref 22–32)
Calcium: 7.5 mg/dL — ABNORMAL LOW (ref 8.9–10.3)
Chloride: 95 mmol/L — ABNORMAL LOW (ref 98–111)
Creatinine, Ser: 9.39 mg/dL — ABNORMAL HIGH (ref 0.44–1.00)
GFR calc Af Amer: 5 mL/min — ABNORMAL LOW (ref >60)
GFR calc non Af Amer: 4 mL/min — ABNORMAL LOW (ref >60)
Glucose, Bld: 111 mg/dL — ABNORMAL HIGH (ref 70–99)
Potassium: 2.8 mmol/L — ABNORMAL LOW (ref 3.5–5.1)
Sodium: 134 mmol/L — ABNORMAL LOW (ref 135–145)
Total Bilirubin: 0.7 mg/dL (ref 0.3–1.2)
Total Protein: 7.4 g/dL (ref 6.5–8.1)

## 2019-08-21 LAB — BASIC METABOLIC PANEL
Anion gap: 20 — ABNORMAL HIGH (ref 5–15)
BUN: 97 mg/dL — ABNORMAL HIGH (ref 6–20)
CO2: 18 mmol/L — ABNORMAL LOW (ref 22–32)
Calcium: 7.6 mg/dL — ABNORMAL LOW (ref 8.9–10.3)
Chloride: 95 mmol/L — ABNORMAL LOW (ref 98–111)
Creatinine, Ser: 8.5 mg/dL — ABNORMAL HIGH (ref 0.44–1.00)
GFR calc Af Amer: 6 mL/min — ABNORMAL LOW (ref >60)
GFR calc non Af Amer: 5 mL/min — ABNORMAL LOW (ref >60)
Glucose, Bld: 104 mg/dL — ABNORMAL HIGH (ref 70–99)
Potassium: 3.2 mmol/L — ABNORMAL LOW (ref 3.5–5.1)
Sodium: 133 mmol/L — ABNORMAL LOW (ref 135–145)

## 2019-08-21 LAB — PHOSPHORUS: Phosphorus: 5.4 mg/dL — ABNORMAL HIGH (ref 2.5–4.6)

## 2019-08-21 LAB — RETICULOCYTES
Immature Retic Fract: 6.9 % (ref 2.3–15.9)
RBC.: 5.03 MIL/uL (ref 3.87–5.11)
Retic Count, Absolute: 18.6 10*3/uL — ABNORMAL LOW (ref 19.0–186.0)
Retic Ct Pct: <0.4 % — ABNORMAL LOW (ref 0.4–3.1)

## 2019-08-21 LAB — URINALYSIS, ROUTINE W REFLEX MICROSCOPIC
Bilirubin Urine: NEGATIVE
Glucose, UA: NEGATIVE mg/dL
Ketones, ur: NEGATIVE mg/dL
Nitrite: NEGATIVE
Protein, ur: 30 mg/dL — AB
Specific Gravity, Urine: 1.011 (ref 1.005–1.030)
pH: 5 (ref 5.0–8.0)

## 2019-08-21 LAB — OSMOLALITY, URINE: Osmolality, Ur: 301 mOsm/kg (ref 300–900)

## 2019-08-21 LAB — FERRITIN: Ferritin: 357 ng/mL — ABNORMAL HIGH (ref 11–307)

## 2019-08-21 LAB — LACTIC ACID, PLASMA: Lactic Acid, Venous: 0.9 mmol/L (ref 0.5–1.9)

## 2019-08-21 LAB — FOLATE: Folate: 9.1 ng/mL (ref >5.9)

## 2019-08-21 LAB — APTT: aPTT: 26 seconds (ref 24–36)

## 2019-08-21 LAB — MAGNESIUM: Magnesium: 2.1 mg/dL (ref 1.7–2.4)

## 2019-08-21 LAB — SODIUM, URINE, RANDOM: Sodium, Ur: 36 mmol/L

## 2019-08-21 LAB — VITAMIN B12: Vitamin B-12: 309 pg/mL (ref 180–914)

## 2019-08-21 MED ORDER — ONDANSETRON HCL 4 MG/2ML IJ SOLN
4.0000 mg | Freq: Four times a day (QID) | INTRAMUSCULAR | Status: DC | PRN
  Administered 2019-08-24: 4 mg via INTRAVENOUS
  Filled 2019-08-21: qty 2

## 2019-08-21 MED ORDER — NICOTINE 14 MG/24HR TD PT24: 14.0000 mg | MEDICATED_PATCH | Freq: Every day | TRANSDERMAL | Status: DC | PRN

## 2019-08-21 MED ORDER — HEPARIN SODIUM (PORCINE) 5000 UNIT/ML IJ SOLN
5000.0000 [IU] | Freq: Three times a day (TID) | INTRAMUSCULAR | Status: DC
  Administered 2019-08-21 – 2019-08-23 (×5): 5000 [IU] via SUBCUTANEOUS
  Filled 2019-08-21 (×5): qty 1

## 2019-08-21 MED ORDER — ACETAMINOPHEN 650 MG RE SUPP: 650.0000 mg | Freq: Four times a day (QID) | RECTAL | Status: DC | PRN

## 2019-08-21 MED ORDER — LACTATED RINGERS IV BOLUS
2000.0000 mL | Freq: Once | INTRAVENOUS | Status: AC
  Administered 2019-08-21: 2000 mL via INTRAVENOUS

## 2019-08-21 MED ORDER — ONDANSETRON HCL 4 MG PO TABS: 4.0000 mg | ORAL_TABLET | Freq: Four times a day (QID) | ORAL | Status: DC | PRN

## 2019-08-21 MED ORDER — SODIUM CHLORIDE 0.9 % IV SOLN
2.0000 g | Freq: Once | INTRAVENOUS | Status: AC
  Administered 2019-08-21: 2 g via INTRAVENOUS
  Filled 2019-08-21: qty 2

## 2019-08-21 MED ORDER — HYDROMORPHONE HCL 1 MG/ML IJ SOLN
0.5000 mg | INTRAMUSCULAR | Status: DC | PRN
  Administered 2019-08-24: 1 mg via INTRAVENOUS
  Filled 2019-08-21: qty 1

## 2019-08-21 MED ORDER — ACETAMINOPHEN 325 MG PO TABS: 650.0000 mg | ORAL_TABLET | Freq: Four times a day (QID) | ORAL | Status: DC | PRN

## 2019-08-21 MED ORDER — SODIUM CHLORIDE 0.9 % IV SOLN
2.0000 g | INTRAVENOUS | Status: DC
  Administered 2019-08-21 – 2019-08-27 (×6): 2 g via INTRAVENOUS
  Filled 2019-08-21: qty 20
  Filled 2019-08-21 (×2): qty 2
  Filled 2019-08-21 (×5): qty 20

## 2019-08-21 MED ORDER — SODIUM CHLORIDE 0.9% FLUSH
3.0000 mL | Freq: Two times a day (BID) | INTRAVENOUS | Status: DC
  Administered 2019-08-21 – 2019-08-27 (×8): 3 mL via INTRAVENOUS

## 2019-08-21 MED ORDER — METRONIDAZOLE IN NACL 5-0.79 MG/ML-% IV SOLN
500.0000 mg | Freq: Three times a day (TID) | INTRAVENOUS | Status: DC
  Administered 2019-08-22 – 2019-08-28 (×20): 500 mg via INTRAVENOUS
  Filled 2019-08-21 (×20): qty 100

## 2019-08-21 MED ORDER — METRONIDAZOLE IN NACL 5-0.79 MG/ML-% IV SOLN
500.0000 mg | Freq: Once | INTRAVENOUS | Status: AC
  Administered 2019-08-21: 500 mg via INTRAVENOUS
  Filled 2019-08-21: qty 100

## 2019-08-21 MED ORDER — DEXTROSE IN LACTATED RINGERS 5 % IV SOLN
INTRAVENOUS | Status: DC
  Filled 2019-08-21: qty 1000
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
## 2019-08-21 NOTE — ED Notes (Signed)
Bladder scan 103ml

## 2019-08-21 NOTE — Progress Notes (Signed)
A consult was received from an ED physician for Cefepime per pharmacy dosing.  The patient's profile has been reviewed for ht/wt/allergies/indication/available labs.   A one time order has been placed for Cefepime 2gm IV.  Further antibiotics/pharmacy consults should be ordered by admitting physician if indicated.                       Thank you, Netta Cedars PharmD 08/21/2019  4:38 PM

## 2019-08-21 NOTE — ED Triage Notes (Addendum)
[facility] transported pt from [facility] to [facility] ED and reports the followings:  Pt was weak and lethargic lying on the bed when EMS arrived. Right upper quad pain and abx distention. Standing BP 70/40. Sitting BP 90/60. Pt reports vomiting for several days and unable to keep fluid or food down. EMS gave 300 mL fluid. NSR 12 lead unremarkable. Pt oriented to self, setting, year.

## 2019-08-21 NOTE — Progress Notes (Signed)
PHARMACY NOTE -  Rocephin  Pharmacy has been assisting with dosing of Rocephin for cholecystitis.  Dosage remains stable at 2g IV q24 and need for further dosage adjustment appears unlikely at present given no renal adjustment required  Pharmacy will sign off, following peripherally for culture results or dose adjustments. Please reconsult if a change in clinical status warrants re-evaluation of dosage.  Reuel Boom, PharmD, BCPS [phone] 08/21/2019, 6:15 PM
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
## 2019-08-22 ENCOUNTER — Encounter (HOSPITAL_COMMUNITY): Payer: Self-pay | Admitting: Family Medicine

## 2019-08-22 ENCOUNTER — Inpatient Hospital Stay (HOSPITAL_COMMUNITY): Payer: Self-pay

## 2019-08-22 LAB — COMPREHENSIVE METABOLIC PANEL
ALT: 21 U/L (ref 0–44)
AST: 29 U/L (ref 15–41)
Albumin: 2.1 g/dL — ABNORMAL LOW (ref 3.5–5.0)
Alkaline Phosphatase: 88 U/L (ref 38–126)
Anion gap: 18 — ABNORMAL HIGH (ref 5–15)
BUN: 117 mg/dL — ABNORMAL HIGH (ref 6–20)
CO2: 18 mmol/L — ABNORMAL LOW (ref 22–32)
Calcium: 7.5 mg/dL — ABNORMAL LOW (ref 8.9–10.3)
Chloride: 96 mmol/L — ABNORMAL LOW (ref 98–111)
Creatinine, Ser: 8.32 mg/dL — ABNORMAL HIGH (ref 0.44–1.00)
GFR calc Af Amer: 6 mL/min — ABNORMAL LOW (ref >60)
GFR calc non Af Amer: 5 mL/min — ABNORMAL LOW (ref >60)
Glucose, Bld: 152 mg/dL — ABNORMAL HIGH (ref 70–99)
Potassium: 3.2 mmol/L — ABNORMAL LOW (ref 3.5–5.1)
Sodium: 132 mmol/L — ABNORMAL LOW (ref 135–145)
Total Bilirubin: 0.8 mg/dL (ref 0.3–1.2)
Total Protein: 5.8 g/dL — ABNORMAL LOW (ref 6.5–8.1)

## 2019-08-22 LAB — PROTIME-INR
INR: 1.1 (ref 0.8–1.2)
Prothrombin Time: 14.1 seconds (ref 11.4–15.2)

## 2019-08-22 LAB — VOLATILES,BLD-ACETONE,ETHANOL,ISOPROP,METHANOL
Acetone, blood: <0.01 g/dL (ref 0.000–0.010)
Ethanol, blood: <0.01 g/dL (ref 0.000–0.010)
Isopropanol, blood: <0.01 g/dL (ref 0.000–0.010)
Methanol, blood: <0.01 g/dL (ref 0.000–0.010)

## 2019-08-22 LAB — CBC
HCT: 29.1 % — ABNORMAL LOW (ref 36.0–46.0)
Hemoglobin: 10 g/dL — ABNORMAL LOW (ref 12.0–15.0)
MCH: 22.4 pg — ABNORMAL LOW (ref 26.0–34.0)
MCHC: 34.4 g/dL (ref 30.0–36.0)
MCV: 65.1 fL — ABNORMAL LOW (ref 80.0–100.0)
Platelets: 142 10*3/uL — ABNORMAL LOW (ref 150–400)
RBC: 4.47 MIL/uL (ref 3.87–5.11)
RDW: 14.3 % (ref 11.5–15.5)
WBC: 14.8 10*3/uL — ABNORMAL HIGH (ref 4.0–10.5)
nRBC: 0 % (ref 0.0–0.2)

## 2019-08-22 LAB — HIV ANTIBODY (ROUTINE TESTING W REFLEX): HIV Screen 4th Generation wRfx: NONREACTIVE

## 2019-08-22 LAB — OSMOLALITY: Osmolality: 309 mOsm/kg — ABNORMAL HIGH (ref 275–295)

## 2019-08-22 LAB — CK: Total CK: 408 U/L — ABNORMAL HIGH (ref 38–234)

## 2019-08-22 LAB — MRSA PCR SCREENING: MRSA by PCR: NEGATIVE

## 2019-08-22 LAB — CREATININE, URINE, RANDOM: Creatinine, Urine: 67.27 mg/dL

## 2019-08-22 MED ORDER — ORAL CARE MOUTH RINSE
15.0000 mL | Freq: Two times a day (BID) | OROMUCOSAL | Status: DC
  Administered 2019-08-23 – 2019-08-27 (×7): 15 mL via OROMUCOSAL

## 2019-08-22 MED ORDER — ORAL CARE MOUTH RINSE
15.0000 mL | Freq: Two times a day (BID) | OROMUCOSAL | Status: DC
  Administered 2019-08-23 – 2019-08-27 (×7): 15 mL via OROMUCOSAL

## 2019-08-22 MED ORDER — CHLORHEXIDINE GLUCONATE 0.12 % MT SOLN
15.0000 mL | Freq: Two times a day (BID) | OROMUCOSAL | Status: DC
  Administered 2019-08-22 – 2019-08-28 (×12): 15 mL via OROMUCOSAL
  Filled 2019-08-22 (×11): qty 15

## 2019-08-22 MED ORDER — CHLORHEXIDINE GLUCONATE CLOTH 2 % EX PADS
6.0000 | MEDICATED_PAD | Freq: Every day | CUTANEOUS | Status: DC
  Administered 2019-08-22 – 2019-08-27 (×6): 6 via TOPICAL

## 2019-08-22 MED ORDER — LACTATED RINGERS IV SOLN: INTRAVENOUS | Status: DC

## 2019-08-22 MED ORDER — SODIUM CHLORIDE 0.9 % IV BOLUS
1500.0000 mL | Freq: Once | INTRAVENOUS | Status: AC
  Administered 2019-08-22: 1500 mL via INTRAVENOUS
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
## 2019-08-22 NOTE — Progress Notes (Signed)
PROGRESS NOTE  CHAVA DULAC  YBO:[id number] DOB: Feb 04, 1966 DOA: 08/21/2019 PCP: None Brief Narrative: Jenna [name] is a 54 y.o. female with no regular medical follow up on no medications who presented to the ED by EMS 7/12 when she was found lethargic on a bed of her motel lethargic with RUQ abdominal pain, N/V, no po intake for days, found to be hypotensive treated with IVF. In the ED hypotension had improved to 104/74. She remained lethargic but oriented, with slurred speech and poor historian, peritoneal signs on exam. Found to have acute renal failure with imaging confirming acute cholecystitis due to cholelithiasis in gallbladder neck. Bladder mass was also noted for which urology is consulted. Multiple metabolic derangements indicating acute renal failure with hypokalemia, elevated anion gap with bicarb 20. WBC 12.7k (PMN-predominant), mild anemia with severe microcytosis/hypochromia and thrombocytopenia at 115k. Lactic acid was normal. LFT's were also normal. CT abdomen/pelvis demonstrated findings consistent with acute cholecystitis with possible perforation, bladder mass without hydronephrosis or urolithiasis. CT head nonacute. General surgery, nephrology, and urology were consulted. IV fluids given, though patient remains lethargic. Surgery tentatively planned 7/14.   Assessment & Plan: Principal Problem:   ARF (acute renal failure) (HCC) Active Problems:   Acute calculous cholecystitis   Bladder mass   Increased anion gap metabolic acidosis   Hypokalemia  Acute renal failure: Dehydration/prerenal suspected, though lack of significant improvement with IVF and rising BUN would suggest sepsis as primary driver.  - Nephrology following. Repeat bolus and increased LR rate.  - Foley inserted, though still no UOP charted.  Acute calculous cholecystitis without biliary obstruction: LFTs wnl. ?contained perforation vs. Phrygian cap on non-contrast CT.  - Abdominal exam displaying  protuberance but no peritoneal signs. - General surgery consulted, NPO p MN for intervention 7/14. - Continue with ceftriaxone/flagyl. - Blood cultures NGTD  Bladder mass: Seen on CT abd/pelvis 3.7cm - 3.1cm intraluminal soft tissue density.  - Urology, Dr. Gilford Rile consulted. Will need eventual TURBT.  Anion gap metabolic acidosis: No evidence of ingestion, lactic acid normal and no ketonuria.  - Tx ARF as above and monitor.    Hyponatremia:  - Monitor with fluids as above.  Hypokalemia:  - Slightly improved, supplementation per nephrology.  Thrombocytopenia: Improving. Many abnormalities on smear including schistocytes though normal bilirubin argues against hemolysis. TTP felt to be less likely than sepsis as cause. - Continue monitoring  Acute uremic encephalopathy: Stable, not lethargic to the point of losing airway protection. - Tx ARF as above.  DVT prophylaxis: Heparin Code Status: Full Family Communication: None at bedside Disposition Plan:  Status is: Inpatient  Remains inpatient appropriate because:Hemodynamically unstable, Persistent severe electrolyte disturbances, Altered mental status, IV treatments appropriate due to intensity of illness or inability to take PO and Inpatient level of care appropriate due to severity of illness  Dispo: The patient is from: Home              Anticipated d/c is to: TBD              Anticipated d/c date is: > 3 days              Patient currently is not medically stable to d/c.  Consultants:   General surgery  Nephrology  Urology  Procedures:   None  Antimicrobials:  Cefepime 7/12  Ceftriaxone, flagyl 7/12 >>   Subjective: Pain stable, still very tired. Lethargic but answers questions appropriately. Still in ED. Afebrile, has not made much urine.  Objective: Vitals:  08/22/19 0500 08/22/19 0632 08/22/19 0745 08/22/19 0819  BP: 103/76 102/73 109/72 107/78  Pulse: 64 64 64 62  Resp: 18 15 17 17   Temp:        TempSrc:      SpO2: 97% 97% 97% 97%    Intake/Output Summary (Last 24 hours) at 08/22/2019 0920 Last data filed at 08/22/2019 0851 Gross per 24 hour  Intake 3256 ml  Output --  Net 3256 ml    Gen: Lethargic 53yo F in no distress Pulm: Non-labored breathing room air. Clear to auscultation bilaterally.  CV: Regular borderline bradycardia. No murmur, rub, or gallop. No JVD, no pedal edema. GI: Abdomen soft, protuberant without taut distention, tender in RUQ and less so diffusely, non-distended, with normoactive bowel sounds. Ext: Warm, no deformities Skin: No rashes, lesions or ulcers Neuro: Lethargic but rousable, stable slurred speech but answers questions appropriately, slowed cognition. No asterixis. No focal neurological deficits. Psych: Judgement and insight appear normal. Mood & affect appropriate.   Data Reviewed: I have personally reviewed following labs and imaging studies  CBC: Recent Labs  Lab 08/21/19 1250 08/22/19 0541  WBC 12.7* 14.8*  NEUTROABS 10.1*  --   HGB 11.0* 10.0*  HCT 32.0* 29.1*  MCV 65.0* 65.1*  PLT 115* 381*   Basic Metabolic Panel: Recent Labs  Lab 08/21/19 1250 08/21/19 2057 08/22/19 0541  NA 134* 133* 132*  K 2.8* 3.2* 3.2*  CL 95* 95* 96*  CO2 20* 18* 18*  GLUCOSE 111* 104* 152*  BUN 82* 97* 117*  CREATININE 9.39* 8.50* 8.32*  CALCIUM 7.5* 7.6* 7.5*  MG  --  2.1  --   PHOS  --  5.4*  --    GFR: CrCl cannot be calculated (Unknown ideal weight.). Liver Function Tests: Recent Labs  Lab 08/21/19 1250 08/22/19 0541  AST 32 29  ALT 29 21  ALKPHOS 104 88  BILITOT 0.7 0.8  PROT 7.4 5.8*  ALBUMIN 2.7* 2.1*   No results for input(s): LIPASE, AMYLASE in the last 168 hours. No results for input(s): AMMONIA in the last 168 hours. Coagulation Profile: Recent Labs  Lab 08/21/19 1250 08/22/19 0541  INR 1.1 1.1   Cardiac Enzymes: No results for input(s): CKTOTAL, CKMB, CKMBINDEX, TROPONINI in the last 168 hours. BNP (last 3  results) No results for input(s): PROBNP in the last 8760 hours. HbA1C: No results for input(s): HGBA1C in the last 72 hours. CBG: No results for input(s): GLUCAP in the last 168 hours. Lipid Profile: No results for input(s): CHOL, HDL, LDLCALC, TRIG, CHOLHDL, LDLDIRECT in the last 72 hours. Thyroid Function Tests: No results for input(s): TSH, T4TOTAL, FREET4, T3FREE, THYROIDAB in the last 72 hours. Anemia Panel: Recent Labs    08/21/19 1250  VITAMINB12 309  FOLATE 9.1  FERRITIN 357*  TIBC 237*  IRON 35  RETICCTPCT <0.4*   Urine analysis:    Component Value Date/Time   COLORURINE YELLOW 08/21/2019 1814   APPEARANCEUR HAZY (A) 08/21/2019 1814   LABSPEC 1.011 08/21/2019 1814   PHURINE 5.0 08/21/2019 1814   GLUCOSEU NEGATIVE 08/21/2019 1814   HGBUR MODERATE (A) 08/21/2019 1814   BILIRUBINUR NEGATIVE 08/21/2019 1814   KETONESUR NEGATIVE 08/21/2019 1814   PROTEINUR 30 (A) 08/21/2019 1814   NITRITE NEGATIVE 08/21/2019 1814   LEUKOCYTESUR TRACE (A) 08/21/2019 1814   Recent Results (from the past 240 hour(s))  SARS Coronavirus 2 by RT PCR (hospital order, performed in [facility] hospital lab) Nasopharyngeal Nasopharyngeal Swab  Status: None   Collection Time: 08/21/19  4:00 PM   Specimen: Nasopharyngeal Swab  Result Value Ref Range Status   SARS Coronavirus 2 NEGATIVE NEGATIVE Final    Comment: (NOTE) SARS-CoV-2 target nucleic acids are NOT DETECTED.  The SARS-CoV-2 RNA is generally detectable in upper and lower respiratory specimens during the acute phase of infection. The lowest concentration of SARS-CoV-2 viral copies this assay can detect is 250 copies / mL. A negative result does not preclude SARS-CoV-2 infection and should not be used as the sole basis for treatment or other patient management decisions.  A negative result may occur with improper specimen collection / handling, submission of specimen other than nasopharyngeal swab, presence of viral mutation(s)  within the areas targeted by this assay, and inadequate number of viral copies (<250 copies / mL). A negative result must be combined with clinical observations, patient history, and epidemiological information.  Fact Sheet for Patients:   StrictlyIdeas.no  Fact Sheet for Healthcare Providers: BankingDealers.co.za  This test is not yet approved or  cleared by the [place] FDA and has been authorized for detection and/or diagnosis of SARS-CoV-2 by FDA under an Emergency Use Authorization (EUA).  This EUA will remain in effect (meaning this test can be used) for the duration of the COVID-19 declaration under Section 564(b)(1) of the Act, 21 U.S.C. section 360bbb-3(b)(1), unless the authorization is terminated or revoked sooner.  Performed at [facility], [place] [address], [place],  [address]       Radiology Studies: CT ABDOMEN PELVIS WO CONTRAST  Result Date: 08/21/2019 CLINICAL DATA:  Abdominal distension.  RIGHT upper quadrant pain. EXAM: CT ABDOMEN AND PELVIS WITHOUT CONTRAST TECHNIQUE: Multidetector CT imaging of the abdomen and pelvis was performed following the standard protocol without IV contrast. COMPARISON:  None FINDINGS: Lower chest: RIGHT basilar atelectasis with air bronchograms. Small RIGHT effusion. Hepatobiliary: The gallbladder is distended and appears inflamed. There is a large gallstone towards the neck of the gallbladder measuring 1.8 cm in diameter. The gallbladder measures 5.2 cm (image 37/2). No IV contrast makes evaluation of the gallbladder difficult. There is a portion of the gallbladder fundus which extends along the RIGHT hepatic lobe measuring 2 cm on image 35/2. This either represents contained perforation of the gallbladder or potentially a Phrygian cap. Small amount fluid in Morrison's pouch. Pancreas: Pancreas appears normal.  Inflammation. Spleen: Normal spleen Adrenals/urinary  tract: Adrenal glands normal. Kidneys and ureters normal. Along the anterior RIGHT aspect of the bladder there is a intraluminal soft tissue density measuring 3.7 x 3.1 cm (image 73/2). Stomach/Bowel: Stomach, small-bowel and cecum are normal. The appendix is not identified but there is no pericecal inflammation to suggest appendicitis. Some secondary inflammation of the hepatic flexure transverse mesocolon favored related to gallbladder pathology. The colon and rectosigmoid colon are normal. Vascular/Lymphatic: Abdominal aorta is normal caliber. No periportal or retroperitoneal adenopathy. No pelvic adenopathy. Reproductive: Uterus normal. 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 No adnexal abnormality Other: No free fluid. Musculoskeletal: No aggressive osseous lesion. IMPRESSION: 1. Findings most consistent with ACUTE CHOLECYSTITIS. Large gallstone in neck of the gallbladder with distension of the gallbladder and pericholecystic inflammation. Difficult to tell if there is contained perforation of the fundus of the gallbladder or a distended Phrygian cap (noncontrast exam). Favors distended Phrygian cap. 2. Minimal intraperitoneal free fluid. Small amount of fluid in Morison's pouch. 3. RIGHT basilar atelectasis.  Pneumonia less favored. 4. Inflammation of the transverse mesocolon favored related to cholecystitis. 5. Soft tissue lesion in  the bladder lumen is concerning for BLADDER CARCINOMA. Recommend urology consultation. Electronically Signed   By: Suzy Bouchard M.D.   On: 08/21/2019 16:18   CT Head Wo Contrast  Result Date: 08/21/2019 CLINICAL DATA:  Altered mental status, unclear cause. Additional history provided: Weakness, lethargy, low blood pressure. EXAM: CT HEAD WITHOUT CONTRAST TECHNIQUE: Contiguous axial images were obtained from the base of the skull through the vertex without intravenous contrast. COMPARISON:  No pertinent prior studies available for comparison. FINDINGS: Brain: Cerebral volume is  normal for age. There is no acute intracranial hemorrhage. No demarcated cortical infarct. No extra-axial fluid collection. No evidence of intracranial mass. No midline shift. Vascular: No hyperdense vessel. Skull: Normal. Negative for fracture or focal lesion. Sinuses/Orbits: Visualized orbits show no acute finding. No significant paranasal sinus disease or mastoid effusion at the imaged levels. IMPRESSION: Unremarkable non-contrast CT appearance of the brain for age. No evidence of acute intracranial abnormality. Electronically Signed   By: Kellie Simmering DO   On: 08/21/2019 15:51    Scheduled Meds: . heparin  5,000 Units Subcutaneous Q8H  . sodium chloride flush  3 mL Intravenous Q12H   Continuous Infusions: . cefTRIAXone (ROCEPHIN)  IV Stopped (08/21/19 2348)  . lactated ringers    . metronidazole Stopped (08/22/19 0742)     LOS: 1 day   Time spent: 35 minutes.  Patrecia Pour, MD [facility] [email or web address] 08/22/2019, 9:20 AM

## 2019-08-22 NOTE — Progress Notes (Signed)
[facility] Progress Note  Subjective: feeling okay, stomach hurts, wants to eat but npo.  No UOP recorded using purewick overnight. I/O 3.2 L in and no UOP. BUN 117 and creat 8.32 (down) this am.  No sob or n/v.   Vitals:   08/22/19 0819 08/22/19 0932 08/22/19 1004 08/22/19 1031  BP: 107/78 113/76 110/82 116/77  Pulse: 62 60 65 65  Resp: 17 20 (!) 22 19  Temp:      TempSrc:      SpO2: 97% 96% 95% 97%    Exam:  alert, nad, Ox 3  no jvd  Chest cta bilat  Cor reg no RG  Abd quite distended, tender, dec'd BS   Ext no LE edema, + livedo pattern   Alert, NF, ox3    UA 7/12 - hazy, 0-5 epi, 11-20 wbc +clumps, 6-10 rbc   UNa - 36     UCr - pend   CT abd - normal kidneys in appearance, no hydro    Assessment/ Plan: 1. AKI - hypovolemia and/or sepsis/ ATN. No UOP just yet, will place foley. LR reordered as not getting this am , at 150 cc/hr, and will bolus another 1.5 L , has room for volume.  Creat down to 8's but if not making urine this is not improvement in renal function but rather just hemodilution. Will follow.  2. RUQ pain - suspected cholecystitis, acute; surgery is consulting 3. Thrombocytopenia - +schistocytes on smear, seems that TTP is less likely than above. LDH and haptoglobin ordered.  4. Met Acidosis -  with inc AG likely related to renal failure, lactic acid is okay 5. Bladder mass - not obstructing, will need urology input at some point     [place] 08/22/2019, 10:59 AM   Recent Labs  Lab 08/21/19 1250 08/21/19 1250 08/21/19 2057 08/22/19 0541  K 2.8*   < > 3.2* 3.2*  BUN 82*   < > 97* 117*  CREATININE 9.39*   < > 8.50* 8.32*  CALCIUM 7.5*   < > 7.6* 7.5*  PHOS  --   --  5.4*  --   HGB 11.0*  --   --  10.0*   < > = values in this interval not displayed.   Inpatient medications: . heparin  5,000 Units Subcutaneous Q8H  . sodium chloride flush  3 mL Intravenous Q12H   . cefTRIAXone (ROCEPHIN)  IV Stopped (08/21/19 2348)  .  lactated ringers    . metronidazole Stopped (08/22/19 0742)   acetaminophen **OR** acetaminophen, HYDROmorphone (DILAUDID) injection, nicotine, ondansetron **OR** ondansetron (ZOFRAN) IV

## 2019-08-22 NOTE — ED Notes (Signed)
MD at bedside. 
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
## 2019-08-22 NOTE — Progress Notes (Signed)
[facility] Progress Note     Subjective: Patient lethargic this AM but wakes and answers questions. She is oriented. She reports some abdominal pain. She wants something to drink.   Objective: Vital signs in last 24 hours: Temp:  [97.7 F (36.5 C)-98.7 F (37.1 C)] 98.7 F (37.1 C) (07/12 1700) Pulse Rate:  [56-75] 64 (07/13 0745) Resp:  [14-22] 17 (07/13 0745) BP: (95-121)/(67-90) 109/72 (07/13 0745) SpO2:  [96 %-100 %] 97 % (07/13 0745)    Intake/Output from previous day: 07/12 0701 - 07/13 0700 In: 2256 [I.V.:156; IV Piggyback:2100] Out: -  Intake/Output this shift: Total I/O In: 100 [IV Piggyback:100] Out: -   PE: General: lethargic, WD, ill appearing female lying in bed HEENT:  Sclera are noninjected.  PERRL.  Ears and nose without any masses or lesions.  Mouth is pink and moist Heart: regular, rate, and rhythm. Palpable radial and pedal pulses bilaterally Lungs: CTAB, no wheezes, rhonchi, or rales noted.  Respiratory effort nonlabored Abd: soft, ttp in RUQ, ND, +BS, no masses, hernias, or organomegaly MS: all 4 extremities are symmetrical with no cyanosis, clubbing, or edema. Skin: warm and dry with no masses, lesions, or rashes Neuro: Cranial nerves 2-12 grossly intact, sensation grossly intact  Psych: A&Ox3    Lab Results:  Recent Labs    08/21/19 1250 08/22/19 0541  WBC 12.7* 14.8*  HGB 11.0* 10.0*  HCT 32.0* 29.1*  PLT 115* 142*   BMET Recent Labs    08/21/19 2057 08/22/19 0541  NA 133* 132*  K 3.2* 3.2*  CL 95* 96*  CO2 18* 18*  GLUCOSE 104* 152*  BUN 97* 117*  CREATININE 8.50* 8.32*  CALCIUM 7.6* 7.5*   PT/INR Recent Labs    08/21/19 1250 08/22/19 0541  LABPROT 13.5 14.1  INR 1.1 1.1   CMP     Component Value Date/Time   NA 132 (L) 08/22/2019 0541   K 3.2 (L) 08/22/2019 0541   CL 96 (L) 08/22/2019 0541   CO2 18 (L) 08/22/2019 0541   GLUCOSE 152 (H) 08/22/2019 0541   BUN 117 (H) 08/22/2019 0541   CREATININE 8.32  (H) 08/22/2019 0541   CREATININE 0.78 05/17/2013 1003   CALCIUM 7.5 (L) 08/22/2019 0541   PROT 5.8 (L) 08/22/2019 0541   ALBUMIN 2.1 (L) 08/22/2019 0541   AST 29 08/22/2019 0541   ALT 21 08/22/2019 0541   ALKPHOS 88 08/22/2019 0541   BILITOT 0.8 08/22/2019 0541   GFRNONAA 5 (L) 08/22/2019 0541   GFRAA 6 (L) 08/22/2019 0541   Lipase  No results found for: LIPASE     Studies/Results: CT ABDOMEN PELVIS WO CONTRAST  Result Date: 08/21/2019 CLINICAL DATA:  Abdominal distension.  RIGHT upper quadrant pain. EXAM: CT ABDOMEN AND PELVIS WITHOUT CONTRAST TECHNIQUE: Multidetector CT imaging of the abdomen and pelvis was performed following the standard protocol without IV contrast. COMPARISON:  None FINDINGS: Lower chest: RIGHT basilar atelectasis with air bronchograms. Small RIGHT effusion. Hepatobiliary: The gallbladder is distended and appears inflamed. There is a large gallstone towards the neck of the gallbladder measuring 1.8 cm in diameter. The gallbladder measures 5.2 cm (image 37/2). No IV contrast makes evaluation of the gallbladder difficult. There is a portion of the gallbladder fundus which extends along the RIGHT hepatic lobe measuring 2 cm on image 35/2. This either represents contained perforation of the gallbladder or potentially a Phrygian cap. Small amount fluid in Morrison's pouch. Pancreas: Pancreas appears normal.  Inflammation. Spleen: Normal spleen Adrenals/urinary  tract: Adrenal glands normal. Kidneys and ureters normal. Along the anterior RIGHT aspect of the bladder there is a intraluminal soft tissue density measuring 3.7 x 3.1 cm (image 73/2). Stomach/Bowel: Stomach, small-bowel and cecum are normal. The appendix is not identified but there is no pericecal inflammation to suggest appendicitis. Some secondary inflammation of the hepatic flexure transverse mesocolon favored related to gallbladder pathology. The colon and rectosigmoid colon are normal. Vascular/Lymphatic: Abdominal  aorta is normal caliber. No periportal or retroperitoneal adenopathy. No pelvic adenopathy. Reproductive: Uterus normal. 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 No adnexal abnormality Other: No free fluid. Musculoskeletal: No aggressive osseous lesion. IMPRESSION: 1. Findings most consistent with ACUTE CHOLECYSTITIS. Large gallstone in neck of the gallbladder with distension of the gallbladder and pericholecystic inflammation. Difficult to tell if there is contained perforation of the fundus of the gallbladder or a distended Phrygian cap (noncontrast exam). Favors distended Phrygian cap. 2. Minimal intraperitoneal free fluid. Small amount of fluid in Morison's pouch. 3. RIGHT basilar atelectasis.  Pneumonia less favored. 4. Inflammation of the transverse mesocolon favored related to cholecystitis. 5. 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 Recommend urology consultation. Electronically Signed   By: Suzy Bouchard M.D.   On: 08/21/2019 16:18   CT Head Wo Contrast  Result Date: 08/21/2019 CLINICAL DATA:  Altered mental status, unclear cause. Additional history provided: Weakness, lethargy, low blood pressure. EXAM: CT HEAD WITHOUT CONTRAST TECHNIQUE: Contiguous axial images were obtained from the base of the skull through the vertex without intravenous contrast. COMPARISON:  No pertinent prior studies available for comparison. FINDINGS: Brain: Cerebral volume is normal for age. There is no acute intracranial hemorrhage. No demarcated cortical infarct. No extra-axial fluid collection. No evidence of intracranial mass. No midline shift. Vascular: No hyperdense vessel. Skull: Normal. Negative for fracture or focal lesion. Sinuses/Orbits: Visualized orbits show no acute finding. No significant paranasal sinus disease or mastoid effusion at the imaged levels. IMPRESSION: Unremarkable non-contrast CT appearance of the brain for age. No evidence of acute intracranial abnormality.  Electronically Signed   By: Kellie Simmering DO   On: 08/21/2019 15:51    Anti-infectives: Anti-infectives (From admission, onward)   Start     Dose/Rate Route Frequency Ordered Stop   08/22/19 0200  metroNIDAZOLE (FLAGYL) IVPB 500 mg     Discontinue     500 mg 100 mL/hr over 60 Minutes Intravenous Every 8 hours 08/21/19 1810     08/21/19 2200  cefTRIAXone (ROCEPHIN) 2 g in sodium chloride 0.9 % 100 mL IVPB     Discontinue     2 g 200 mL/hr over 30 Minutes Intravenous Every 24 hours 08/21/19 1813     08/21/19 1645  ceFEPIme (MAXIPIME) 2 g in sodium chloride 0.9 % 100 mL IVPB        2 g 200 mL/hr over 30 Minutes Intravenous  Once 08/21/19 1631 08/21/19 1730   08/21/19 1645  metroNIDAZOLE (FLAGYL) IVPB 500 mg        500 mg 100 mL/hr over 60 Minutes Intravenous  Once 08/21/19 1631 08/21/19 1815       Assessment/Plan Acute renal failure Bladder tumor Depression/Anxiety  Acute cholecystitis  - CT yesterday shows large gallstone in neck of the gallbladder with distension of the gallbladder and pericholecystic inflammation - LFTs and Tbili WNL - WBC 14 from 12 and patient ttp in RUQ - I think that patient may still be a candidate for cholecystectomy if renal function improves in the next 24-48 hrs -  if renal function is not improving, then patient will likely need percutaneous drain with interval cholecystectomy planned   FEN: ok to have a diet from a surgical standpoint today VTE: SQ heparin ID: cefepime 7/12; flagyl 7/12>>, rocephin 7/13>>   LOS: 1 day    Norm Parcel , [facility] 08/22/2019, 8:11 AM Please see Amion for pager number during day hours 7:00am-4:30pm

## 2019-08-23 ENCOUNTER — Inpatient Hospital Stay (HOSPITAL_COMMUNITY): Payer: Self-pay

## 2019-08-23 HISTORY — PX: IR PERC CHOLECYSTOSTOMY: IMG2326

## 2019-08-23 LAB — COMPREHENSIVE METABOLIC PANEL
ALT: 17 U/L (ref 0–44)
AST: 20 U/L (ref 15–41)
Albumin: 1.9 g/dL — ABNORMAL LOW (ref 3.5–5.0)
Alkaline Phosphatase: 83 U/L (ref 38–126)
Anion gap: 18 — ABNORMAL HIGH (ref 5–15)
BUN: 92 mg/dL — ABNORMAL HIGH (ref 6–20)
CO2: 16 mmol/L — ABNORMAL LOW (ref 22–32)
Calcium: 7.7 mg/dL — ABNORMAL LOW (ref 8.9–10.3)
Chloride: 104 mmol/L (ref 98–111)
Creatinine, Ser: 7.1 mg/dL — ABNORMAL HIGH (ref 0.44–1.00)
GFR calc Af Amer: 7 mL/min — ABNORMAL LOW (ref >60)
GFR calc non Af Amer: 6 mL/min — ABNORMAL LOW (ref >60)
Glucose, Bld: 106 mg/dL — ABNORMAL HIGH (ref 70–99)
Potassium: 2.8 mmol/L — ABNORMAL LOW (ref 3.5–5.1)
Sodium: 138 mmol/L (ref 135–145)
Total Bilirubin: 0.5 mg/dL (ref 0.3–1.2)
Total Protein: 5.5 g/dL — ABNORMAL LOW (ref 6.5–8.1)

## 2019-08-23 LAB — CBC
HCT: 28.1 % — ABNORMAL LOW (ref 36.0–46.0)
Hemoglobin: 9.5 g/dL — ABNORMAL LOW (ref 12.0–15.0)
MCH: 22.3 pg — ABNORMAL LOW (ref 26.0–34.0)
MCHC: 33.8 g/dL (ref 30.0–36.0)
MCV: 66 fL — ABNORMAL LOW (ref 80.0–100.0)
Platelets: 239 10*3/uL (ref 150–400)
RBC: 4.26 MIL/uL (ref 3.87–5.11)
RDW: 14.1 % (ref 11.5–15.5)
WBC: 18.8 10*3/uL — ABNORMAL HIGH (ref 4.0–10.5)
nRBC: 0 % (ref 0.0–0.2)

## 2019-08-23 LAB — BASIC METABOLIC PANEL
Anion gap: 17 — ABNORMAL HIGH (ref 5–15)
BUN: 94 mg/dL — ABNORMAL HIGH (ref 6–20)
CO2: 18 mmol/L — ABNORMAL LOW (ref 22–32)
Calcium: 7.8 mg/dL — ABNORMAL LOW (ref 8.9–10.3)
Chloride: 101 mmol/L (ref 98–111)
Creatinine, Ser: 6.01 mg/dL — ABNORMAL HIGH (ref 0.44–1.00)
GFR calc Af Amer: 9 mL/min — ABNORMAL LOW (ref >60)
GFR calc non Af Amer: 7 mL/min — ABNORMAL LOW (ref >60)
Glucose, Bld: 327 mg/dL — ABNORMAL HIGH (ref 70–99)
Potassium: 3.1 mmol/L — ABNORMAL LOW (ref 3.5–5.1)
Sodium: 136 mmol/L (ref 135–145)

## 2019-08-23 LAB — MYOGLOBIN, URINE: Myoglobin, Ur: 2 ng/mL (ref 0–13)

## 2019-08-23 LAB — GLUCOSE, CAPILLARY: Glucose-Capillary: 182 mg/dL — ABNORMAL HIGH (ref 70–99)

## 2019-08-23 LAB — URINE CULTURE

## 2019-08-23 LAB — CALCIUM, IONIZED: Calcium, Ionized, Serum: 4.1 mg/dL — ABNORMAL LOW (ref 4.5–5.6)

## 2019-08-23 MED ORDER — SODIUM CHLORIDE 0.45 % IV SOLN
INTRAVENOUS | Status: DC
  Filled 2019-08-23: qty 1000

## 2019-08-23 MED ORDER — MIDAZOLAM HCL 2 MG/2ML IJ SOLN
INTRAMUSCULAR | Status: AC | PRN
  Administered 2019-08-23 (×2): 1 mg via INTRAVENOUS

## 2019-08-23 MED ORDER — POTASSIUM CHLORIDE CRYS ER 20 MEQ PO TBCR
30.0000 meq | EXTENDED_RELEASE_TABLET | Freq: Three times a day (TID) | ORAL | Status: AC
  Administered 2019-08-23 (×2): 30 meq via ORAL
  Filled 2019-08-23 (×2): qty 1

## 2019-08-23 MED ORDER — LIDOCAINE HCL 1 % IJ SOLN
INTRAMUSCULAR | Status: AC
  Filled 2019-08-23: qty 20

## 2019-08-23 MED ORDER — IOHEXOL 300 MG/ML  SOLN
50.0000 mL | Freq: Once | INTRAMUSCULAR | Status: AC | PRN
  Administered 2019-08-23: 5 mL

## 2019-08-23 MED ORDER — FENTANYL CITRATE (PF) 100 MCG/2ML IJ SOLN
INTRAMUSCULAR | Status: AC
  Filled 2019-08-23: qty 2

## 2019-08-23 MED ORDER — POTASSIUM CHLORIDE CRYS ER 10 MEQ PO TBCR
10.0000 meq | EXTENDED_RELEASE_TABLET | Freq: Once | ORAL | Status: AC
  Administered 2019-08-23: 10 meq via ORAL
  Filled 2019-08-23: qty 1

## 2019-08-23 MED ORDER — LIDOCAINE HCL (PF) 1 % IJ SOLN
INTRAMUSCULAR | Status: AC | PRN
  Administered 2019-08-23: 10 mL via INTRADERMAL

## 2019-08-23 MED ORDER — MIDAZOLAM HCL 2 MG/2ML IJ SOLN
INTRAMUSCULAR | Status: AC
  Filled 2019-08-23: qty 4

## 2019-08-23 MED ORDER — SODIUM BICARBONATE 8.4 % IV SOLN
INTRAVENOUS | Status: DC
  Filled 2019-08-23 (×4): qty 1000

## 2019-08-23 MED ORDER — FENTANYL CITRATE (PF) 100 MCG/2ML IJ SOLN
INTRAMUSCULAR | Status: AC | PRN
  Administered 2019-08-23 (×2): 50 ug via INTRAVENOUS

## 2019-08-23 MED ORDER — SODIUM CHLORIDE 0.9% FLUSH
5.0000 mL | Freq: Three times a day (TID) | INTRAVENOUS | Status: DC
  Administered 2019-08-23 – 2019-08-29 (×16): 5 mL

## 2019-08-23 MED ORDER — HEPARIN SODIUM (PORCINE) 5000 UNIT/ML IJ SOLN
5000.0000 [IU] | Freq: Three times a day (TID) | INTRAMUSCULAR | Status: DC
  Administered 2019-08-23 – 2019-08-29 (×17): 5000 [IU] via SUBCUTANEOUS
  Filled 2019-08-23 (×17): qty 1

## 2019-08-23 NOTE — Progress Notes (Signed)
MEDICATION-RELATED CONSULT NOTE   IR Procedure Consult - Anticoagulant/Antiplatelet PTA/Inpatient Med List Review by Pharmacist    Procedure: 3F percutaneous cholecystostomy tube placement    Completed: 7/14/20201 at 12:06  Post-Procedural bleeding risk per IR MD assessment:  Standard   Antithrombotic medications on inpatient or PTA profile prior to procedure:    - Heparin 5000 units q8h     Recommended restart time per IR Post-Procedure Guidelines:     Day 0  (at least 6 hours or at next standard dose interval)       Other considerations:      Plan:     - Heparin due to resume tonight at 2200 on 7/14, 8 hours after end of procedure     Royetta Asal, PharmD, BCPS 08/23/2019 2:06 PM

## 2019-08-23 NOTE — Progress Notes (Signed)
Progress Note: General Surgery Service   Chief Complaint/Subjective: Some abdominal pain, still slow in speech, noted getting very weak 2 days ago and then having abdominal pain and vomiting  Objective: Vital signs in last 24 hours: Temp:  [98.2 F (36.8 C)-98.5 F (36.9 C)] 98.4 F (36.9 C) (07/14 0348) Pulse Rate:  [60-74] 72 (07/14 0600) Resp:  [15-24] 17 (07/14 0600) BP: (107-130)/(55-85) 112/67 (07/14 0600) SpO2:  [94 %-99 %] 94 % (07/14 0600) Weight:  [68.8 kg] 68.8 kg (07/14 0500) Last BM Date:  (PTA)  Intake/Output from previous day: 07/13 0701 - 07/14 0700 In: 4296.6 [I.V.:2303.4; IV Piggyback:1993.2] Out: 2600 [Urine:2600] Intake/Output this shift: No intake/output data recorded.  Gen: NAD  Resp: nonlabored  Card: RRR  Abd: soft, TTP RUQ  Lab Results: CBC  Recent Labs    08/22/19 0541 08/23/19 0239  WBC 14.8* 18.8*  HGB 10.0* 9.5*  HCT 29.1* 28.1*  PLT 142* 239   BMET Recent Labs    08/22/19 0541 08/23/19 0239  NA 132* 138  K 3.2* 2.8*  CL 96* 104  CO2 18* 16*  GLUCOSE 152* 106*  BUN 117* 92*  CREATININE 8.32* 7.10*  CALCIUM 7.5* 7.7*   PT/INR Recent Labs    08/21/19 1250 08/22/19 0541  LABPROT 13.5 14.1  INR 1.1 1.1   ABG No results for input(s): PHART, HCO3 in the last 72 hours.  Invalid input(s): PCO2, PO2  Anti-infectives: Anti-infectives (From admission, onward)   Start     Dose/Rate Route Frequency Ordered Stop   08/22/19 0200  metroNIDAZOLE (FLAGYL) IVPB 500 mg     Discontinue     500 mg 100 mL/hr over 60 Minutes Intravenous Every 8 hours 08/21/19 1810     08/21/19 2200  cefTRIAXone (ROCEPHIN) 2 g in sodium chloride 0.9 % 100 mL IVPB     Discontinue     2 g 200 mL/hr over 30 Minutes Intravenous Every 24 hours 08/21/19 1813     08/21/19 1645  ceFEPIme (MAXIPIME) 2 g in sodium chloride 0.9 % 100 mL IVPB        2 g 200 mL/hr over 30 Minutes Intravenous  Once 08/21/19 1631 08/21/19 1730   08/21/19 1645  metroNIDAZOLE  (FLAGYL) IVPB 500 mg        500 mg 100 mL/hr over 60 Minutes Intravenous  Once 08/21/19 1631 08/21/19 1815      Medications: Scheduled Meds: . chlorhexidine  15 mL Mouth Rinse BID  . Chlorhexidine Gluconate Cloth  6 each Topical Daily  . heparin  5,000 Units Subcutaneous Q8H  . mouth rinse  15 mL Mouth Rinse BID  . mouth rinse  15 mL Mouth Rinse q12n4p  . sodium chloride flush  3 mL Intravenous Q12H   Continuous Infusions: . cefTRIAXone (ROCEPHIN)  IV Stopped (08/22/19 2130)  . lactated ringers 150 mL/hr at 08/23/19 0600  . metronidazole Stopped (08/23/19 0215)   PRN Meds:.acetaminophen **OR** acetaminophen, HYDROmorphone (DILAUDID) injection, nicotine, ondansetron **OR** ondansetron (ZOFRAN) IV  Assessment/Plan: 54 yo female with ARF and concern for acute cholecystitis. She is still slow in speech though more alert today. Due to other ongoing medical issues I think it is best to proceed with IR drain today rather than surgery -perc chole tube today -continue abx -NPO for procedure  LOS: 2 days   Mickeal Skinner, MD [facility]

## 2019-08-23 NOTE — Progress Notes (Signed)
[facility] Progress Note  Subjective: feeling better, some slurred speech. UOP better and creat down to 7.10 this am, BUN 92.  abd pain a little better  Vitals:   08/23/19 0500 08/23/19 0600 08/23/19 0700 08/23/19 0800  BP: (!) 111/55 112/67 120/72 123/75  Pulse: 67 72 71 73  Resp: 17 17 20 17  Temp:    97.9 F (36.6 C)  TempSrc:    Oral  SpO2: 95% 94% 95% 94%  Weight: 68.8 kg     Height:        Exam:  alert, slurred speech, no distress  no jvd  Chest cta bilat  Cor reg no RG  Abd not as distended or tender, softer , still mod distension, decd BS   Ext no LE edema   Alert, NF, ox3    UA 7/12 - hazy, 0-5 epi, 11-20 wbc +clumps, 6-10 rbc   UNa - 36     UCr - pend   CT abd - normal kidneys in appearance, no hydro    Assessment/ Plan: 1. AKI - hypovolemia and/or sepsis/ ATN. Appears to be recovering w/ very good UOP and declining BUN/ Cr. Her slurred speech and behavior may in some part be uremia related, however w/ declining creat and very good UOP would not recommend RRT , just cont IVF's and allow recovery to continue.  2. Hypokalemia - Kcl in IVF and po Kdur ordered  3. RUQ pain - suspected cholecystitis, acute; surgery is consulting, IR consult for perc tube 4. Thrombocytopenia - resolved w/ IV abx, plts up 265 5. Met Acidosis -  Will change IVF to bicarb 6. Bladder mass - not obstructing, will need urology input at some point     Rob Schertz 08/23/2019, 9:36 AM   Recent Labs  Lab 08/21/19 2057 08/21/19 2057 08/22/19 0541 08/23/19 0239  K 3.2*   < > 3.2* 2.8*  BUN 97*   < > 117* 92*  CREATININE 8.50*   < > 8.32* 7.10*  CALCIUM 7.6*   < > 7.5* 7.7*  PHOS 5.4*  --   --   --   HGB  --   --  10.0* 9.5*   < > = values in this interval not displayed.   Inpatient medications: . chlorhexidine  15 mL Mouth Rinse BID  . Chlorhexidine Gluconate Cloth  6 each Topical Daily  . heparin  5,000 Units Subcutaneous Q8H  . mouth rinse  15 mL Mouth Rinse  BID  . mouth rinse  15 mL Mouth Rinse q12n4p  . sodium chloride flush  3 mL Intravenous Q12H   . cefTRIAXone (ROCEPHIN)  IV Stopped (08/22/19 2130)  . lactated ringers 150 mL/hr at 08/23/19 0600  . metronidazole 500 mg (08/23/19 0928)   acetaminophen **OR** acetaminophen, HYDROmorphone (DILAUDID) injection, nicotine, ondansetron **OR** ondansetron (ZOFRAN) IV       

## 2019-08-23 NOTE — Progress Notes (Signed)
PROGRESS NOTE  AANCHAL COPE RXV:[id number] DOB: 04-17-1965 DOA: 08/21/2019 PCP: Patient, No Pcp Per  Brief History   PSALMS OLARTE is a 54 y.o. female with no regular medical follow up on no medications who presented to the ED by EMS 7/12 when she was found lethargic on a bed of her motel lethargic with RUQ abdominal pain, N/V, no po intake for days, found to be hypotensive treated with IVF. In the ED hypotension had improved to 104/74. She remained lethargic but oriented, with slurred speech and poor historian, peritoneal signs on exam. Found to have acute renal failure with imaging confirming acute cholecystitis due to cholelithiasis in gallbladder neck. Bladder mass was also noted for which urology is consulted. Multiple metabolic derangements indicating acute renal failure with hypokalemia, elevated anion gap with bicarb 20. WBC 12.7k (PMN-predominant), mild anemia with severe microcytosis/hypochromia and thrombocytopenia at 115k. Lactic acid was normal. LFT's were also normal. CT abdomen/pelvis demonstrated findings consistent with acute cholecystitis with possible perforation, bladder mass without hydronephrosis or urolithiasis. CT head nonacute. General surgery, nephrology, and urology were consulted. IV fluids given, though patient remains lethargic. The plan is for interventional radiology to place percutaneous cholecystostomy tube on the afternoon of 08/23/2019. Urology has determined that the bladder mass is concerning for a urothelial carcinoma. They plan to schedule the patient for an outpatient TURBT once her cholecystitis has been addressed.  Consultants   Interventional Radiology  General Surgery  Nephrology  Urology  Procedures   None  Antibiotics   Anti-infectives (From admission, onward)   Start     Dose/Rate Route Frequency Ordered Stop   08/22/19 0200  metroNIDAZOLE (FLAGYL) IVPB 500 mg     Discontinue     500 mg 100 mL/hr over 60 Minutes Intravenous Every 8  hours 08/21/19 1810     08/21/19 2200  cefTRIAXone (ROCEPHIN) 2 g in sodium chloride 0.9 % 100 mL IVPB     Discontinue     2 g 200 mL/hr over 30 Minutes Intravenous Every 24 hours 08/21/19 1813     08/21/19 1645  ceFEPIme (MAXIPIME) 2 g in sodium chloride 0.9 % 100 mL IVPB        2 g 200 mL/hr over 30 Minutes Intravenous  Once 08/21/19 1631 08/21/19 1730   08/21/19 1645  metroNIDAZOLE (FLAGYL) IVPB 500 mg        500 mg 100 mL/hr over 60 Minutes Intravenous  Once 08/21/19 1631 08/21/19 1815       Subjective  The patient is somnolent, but will follow commands with her eyes closed. No new complaints.  Objective   Vitals:  Vitals:   08/23/19 1220 08/23/19 1232  BP:    Pulse:    Resp:    Temp:  97.6 F (36.4 C)  SpO2: 100%     Exam:  Constitutional:   The patient is somnolent, but briefly rouseable. No acute distress. Respiratory:   No increased work of breathing.  No wheezes, rales, or rhonchi  No tactile fremitus Cardiovascular:   Regular rate and rhythm  No murmurs, ectopy, or gallups.  No lateral PMI. No thrills. Abdomen:   Abdomen is diffusely distended and tender.  No hernias, masses, or organomegaly  Hypoactive bowel sounds.  Musculoskeletal:   No cyanosis, clubbing, or edema Skin:   No rashes, lesions, ulcers  palpation of skin: no induration or nodules Neurologic:   Pt is unable to cooperate with exam. Psychiatric:   Pt is unable to cooperate with exam.  I have personally reviewed the following:   Today's Data   Vitals, BMP, CBC  Micro Data   Urine culture: Polymicrobial.  Imaging   Renal ultrasound  Scheduled Meds:  chlorhexidine  15 mL Mouth Rinse BID   Chlorhexidine Gluconate Cloth  6 each Topical Daily   heparin  5,000 Units Subcutaneous Q8H   lidocaine       mouth rinse  15 mL Mouth Rinse BID   mouth rinse  15 mL Mouth Rinse q12n4p   potassium chloride  30 mEq Oral TID   sodium chloride flush  3 mL  Intravenous Q12H   Continuous Infusions:  cefTRIAXone (ROCEPHIN)  IV Stopped (08/22/19 2130)   dextrose 5 % 1,000 mL with potassium chloride 40 mEq, sodium bicarbonate 100 mEq infusion 125 mL/hr at 08/23/19 1247   metronidazole Stopped (08/23/19 1032)    Principal Problem:   ARF (acute renal failure) (HCC) Active Problems:   Acute calculous cholecystitis   Bladder mass   Increased anion gap metabolic acidosis   Hypokalemia   LOS: 2 days   A & P  Acuterenal failure: Dehydration/prerenal suspected, though lack of significant improvement with IVF and rising BUN would suggest sepsis as primary driver. Repeat bolus and increased LR rate. Urine output is improved as charted. I appreciate nephrology's assistance. Monitor creatinine, electrolytes, and volume status. Avoid nephrotoxins and   Acute calculous cholecystitis without biliary obstruction: LFTs wnl. Possible contained perforation vs. Phrygian cap on non-contrast CT. the patient's abdomen is distended on exam, but does not appear to be an acute abdodmen. No peritoneal signs. Interventional radiology will take the patient for transcutaneous placement of a cholecystostomy tube. The patient is receiving ceftriaxone and flagyl. The blood cultures (1) collected on 08/21/2019 has had no growth.  Bladder mass: Seen on CT abd/pelvis 3.7cm - 3.1cm intraluminal soft tissue density. Urology has been consulted. Will need eventual TURBT. Dr. Gilford Rile plans to schedule as outpatient after the patient has recovered from biliary illness and acute renal failure.  Anion gap metabolic acidosis: No evidence of ingestion, lactic acid normal and no ketonuria. Due to acute kidney injury. Monitor CO2. Consider oral sodium bicarbonate.  Hyponatremia: Minimal. Sodium is 133 this morning. Monitor and continue IV fluids as above.  Hypokalemia: Ongoing. K down to 2.8 again this am. Supplement and monitor. Continue to monitor the patient on telemetry. Will  also check magnesium.  Thrombocytopenia: Improving. Many abnormalities on smear including schistocytes though normal bilirubin argues against hemolysis. TTP felt to be less likely than sepsis as cause. Platelets are 239 this morning. Likely reactive due to sepsis.  Acute uremic encephalopathy: Somnolent, but following commands. Continue to monitor.  I have seen and examined this patient myself. I have spent 35 minutes in her evaluation and care.  DVT prophylaxis: Heparin Code Status: Full Family Communication: None at bedside Disposition Plan:  Status is: Inpatient  Remains inpatient appropriate because:Hemodynamically unstable, Persistent severe electrolyte disturbances, Altered mental status, IV treatments appropriate due to intensity of illness or inability to take PO and Inpatient level of care appropriate due to severity of illness  Dispo: The patient is from: Home  Anticipated d/c is to: TBD. PT/OT has been ordered.  Anticipated d/c date is: > 3 days  Patient currently is not medically stable to d/c.   Tywan Siever, DO [facility] Direct contact: see [email or web address]  7PM-7AM contact night coverage as above 08/23/2019, 1:05 PM  LOS: 2 days

## 2019-08-23 NOTE — Procedures (Signed)
Interventional Radiology Procedure Note  Procedure: 29F percutaneous cholecystostomy tube placement  Complications: None  Estimated Blood Loss: None  Recommendations: - Tube to bag drainage - Bile aspirate sent for culture    Signed,  Criselda Peaches, MD

## 2019-08-23 NOTE — Progress Notes (Signed)
[facility] on call paged about K of 2.8. Pt NSR & denies chest pain. [facility] stated it will be referred to nephrology per a previous note. RN will continue to monitor
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
## 2019-08-24 ENCOUNTER — Inpatient Hospital Stay (HOSPITAL_COMMUNITY): Payer: Self-pay

## 2019-08-24 DIAGNOSIS — R471 Dysarthria and anarthria: Secondary | ICD-10-CM

## 2019-08-24 LAB — BLOOD CULTURE ID PANEL (REFLEXED)

## 2019-08-24 LAB — HEMOGLOBIN A1C
Hgb A1c MFr Bld: 6.4 % — ABNORMAL HIGH (ref 4.8–5.6)
Mean Plasma Glucose: 136.98 mg/dL

## 2019-08-24 LAB — GLUCOSE, CAPILLARY
Glucose-Capillary: 144 mg/dL — ABNORMAL HIGH (ref 70–99)
Glucose-Capillary: 158 mg/dL — ABNORMAL HIGH (ref 70–99)
Glucose-Capillary: 169 mg/dL — ABNORMAL HIGH (ref 70–99)
Glucose-Capillary: 231 mg/dL — ABNORMAL HIGH (ref 70–99)

## 2019-08-24 LAB — CBC WITH DIFFERENTIAL/PLATELET
Abs Immature Granulocytes: 0.3 10*3/uL — ABNORMAL HIGH (ref 0.00–0.07)
Basophils Absolute: 0.1 10*3/uL (ref 0.0–0.1)
Basophils Relative: 0 %
Eosinophils Absolute: 0.1 10*3/uL (ref 0.0–0.5)
Eosinophils Relative: 1 %
HCT: 25.2 % — ABNORMAL LOW (ref 36.0–46.0)
Hemoglobin: 8.5 g/dL — ABNORMAL LOW (ref 12.0–15.0)
Immature Granulocytes: 2 %
Lymphocytes Relative: 16 %
Lymphs Abs: 2.6 10*3/uL (ref 0.7–4.0)
MCH: 22.1 pg — ABNORMAL LOW (ref 26.0–34.0)
MCHC: 33.7 g/dL (ref 30.0–36.0)
MCV: 65.5 fL — ABNORMAL LOW (ref 80.0–100.0)
Monocytes Absolute: 1 10*3/uL (ref 0.1–1.0)
Monocytes Relative: 6 %
Neutro Abs: 12.6 10*3/uL — ABNORMAL HIGH (ref 1.7–7.7)
Neutrophils Relative %: 75 %
Platelets: 300 10*3/uL (ref 150–400)
RBC: 3.85 MIL/uL — ABNORMAL LOW (ref 3.87–5.11)
RDW: 13.6 % (ref 11.5–15.5)
WBC: 16.7 10*3/uL — ABNORMAL HIGH (ref 4.0–10.5)
nRBC: 0 % (ref 0.0–0.2)

## 2019-08-24 LAB — BASIC METABOLIC PANEL
Anion gap: 13 (ref 5–15)
BUN: 82 mg/dL — ABNORMAL HIGH (ref 6–20)
CO2: 22 mmol/L (ref 22–32)
Calcium: 7.6 mg/dL — ABNORMAL LOW (ref 8.9–10.3)
Chloride: 101 mmol/L (ref 98–111)
Creatinine, Ser: 5.3 mg/dL — ABNORMAL HIGH (ref 0.44–1.00)
GFR calc Af Amer: 10 mL/min — ABNORMAL LOW (ref >60)
GFR calc non Af Amer: 9 mL/min — ABNORMAL LOW (ref >60)
Glucose, Bld: 181 mg/dL — ABNORMAL HIGH (ref 70–99)
Potassium: 3.5 mmol/L (ref 3.5–5.1)
Sodium: 136 mmol/L (ref 135–145)

## 2019-08-24 MED ORDER — POTASSIUM CHLORIDE 2 MEQ/ML IV SOLN
INTRAVENOUS | Status: DC
  Filled 2019-08-24: qty 1000

## 2019-08-24 NOTE — Progress Notes (Signed)
OT Cancellation Note  Patient Details Name: Jenna [name] MRN: [id number] DOB: Sep 03, 1965   Cancelled Treatment:    Reason Eval/Treat Not Completed: Patient at procedure or test/ unavailable. Upon arrival to room Rn report patient leaving floor soon. Will check back as schedule allows.  Delbert Phenix OT Pager: [place] 08/24/2019, 10:24 AM

## 2019-08-24 NOTE — Progress Notes (Signed)
Progress Note: General Surgery Service   Chief Complaint/Subjective: Drain yesterday, no issues overnight, no nausea  Objective: Vital signs in last 24 hours: Temp:  [97.6 F (36.4 C)-99.3 F (37.4 C)] 99.3 F (37.4 C) (07/15 0800) Pulse Rate:  [68-96] 79 (07/15 0600) Resp:  [14-27] 22 (07/15 0600) BP: (104-154)/(47-92) 111/61 (07/15 0600) SpO2:  [92 %-100 %] 93 % (07/15 0600) Weight:  [72.4 kg] 72.4 kg (07/15 0500) Last BM Date: 08/21/19  Intake/Output from previous day: 07/14 0701 - 07/15 0700 In: 2796.5 [P.O.:240; I.V.:2153.3; IV Piggyback:398.2] Out: 2895 [Urine:2870; Drains:25] Intake/Output this shift: No intake/output data recorded.  Gen: NAD  Resp: nonlabored  Card: RRR  Abd: soft, TTP RUQ, drain in place with purulent output  Lab Results: CBC  Recent Labs    08/23/19 0239 08/24/19 0228  WBC 18.8* 16.7*  HGB 9.5* 8.5*  HCT 28.1* 25.2*  PLT 239 300   BMET Recent Labs    08/23/19 1859 08/24/19 0228  NA 136 136  K 3.1* 3.5  CL 101 101  CO2 18* 22  GLUCOSE 327* 181*  BUN 94* 82*  CREATININE 6.01* 5.30*  CALCIUM 7.8* 7.6*   PT/INR Recent Labs    08/21/19 1250 08/22/19 0541  LABPROT 13.5 14.1  INR 1.1 1.1   ABG No results for input(s): PHART, HCO3 in the last 72 hours.  Invalid input(s): PCO2, PO2  Anti-infectives: Anti-infectives (From admission, onward)   Start     Dose/Rate Route Frequency Ordered Stop   08/22/19 0200  metroNIDAZOLE (FLAGYL) IVPB 500 mg     Discontinue     500 mg 100 mL/hr over 60 Minutes Intravenous Every 8 hours 08/21/19 1810     08/21/19 2200  cefTRIAXone (ROCEPHIN) 2 g in sodium chloride 0.9 % 100 mL IVPB     Discontinue     2 g 200 mL/hr over 30 Minutes Intravenous Every 24 hours 08/21/19 1813     08/21/19 1645  ceFEPIme (MAXIPIME) 2 g in sodium chloride 0.9 % 100 mL IVPB        2 g 200 mL/hr over 30 Minutes Intravenous  Once 08/21/19 1631 08/21/19 1730   08/21/19 1645  metroNIDAZOLE (FLAGYL) IVPB 500 mg         500 mg 100 mL/hr over 60 Minutes Intravenous  Once 08/21/19 1631 08/21/19 1815      Medications: Scheduled Meds:  chlorhexidine  15 mL Mouth Rinse BID   Chlorhexidine Gluconate Cloth  6 each Topical Daily   heparin  5,000 Units Subcutaneous Q8H   mouth rinse  15 mL Mouth Rinse BID   mouth rinse  15 mL Mouth Rinse q12n4p   sodium chloride flush  3 mL Intravenous Q12H   sodium chloride flush  5 mL Intracatheter Q8H   Continuous Infusions:  cefTRIAXone (ROCEPHIN)  IV Stopped (08/23/19 2314)   dextrose 5 % 1,000 mL with potassium chloride 40 mEq, sodium bicarbonate 100 mEq infusion 125 mL/hr at 08/24/19 0744   metronidazole Stopped (08/24/19 0327)   PRN Meds:.acetaminophen **OR** acetaminophen, HYDROmorphone (DILAUDID) injection, nicotine, ondansetron **OR** ondansetron (ZOFRAN) IV  Assessment/Plan: 54 yo with cholecystitis s/p drain, BCx showing G- species -continue ceftriaxone -ok for diet per surgery, will order speech and PT due to mental status issues -continue drain care   LOS: 3 days   Mickeal Skinner, MD [facility]

## 2019-08-24 NOTE — Evaluation (Signed)
Occupational Therapy Evaluation Patient Details Name: Jenna [name] MRN: [id number] DOB: 04-09-1965 Today's Date: 08/24/2019    History of Present Illness Patient is a 54 year old female admitted 08/21/19 with lethargy, abdominal pain, passing out. Severe renal failure, with cholecystitis, bladder tumor and large gallstone found.  PMH: depression, anxiety. 08/23/19 percutaneous cholecystostomy tube placement    Clinical Impression   Patient with functional deficits listed below impacting safety and independence with self care. Currently patient groggy with difficulty keeping eyes open, sustaining attention and increased time to follow through with directions. Mod A to problem solve bed mobility and mod A to elevate trunk to sitting. Patient report dizziness with sitting up initially, BP taken 125/63 with dizziness resolved after ~1 min. Patient having difficulty sequencing to scoot up towards head of bed with mod A. Currently patient requiring increased assistance with self care tasks and functional mobility, at baseline independent and works as busser. Recommending continued rehab at discharge at this time due to deficits listed below. Will continue to follow for patient progress.    Follow Up Recommendations  Supervision/Assistance - 24 hour;SNF;Other (comment) (pending progress)    Equipment Recommendations  Other (comment) (TBD)       Precautions / Restrictions Precautions Precautions: Fall Precaution Comments: R perc chole tube  Restrictions Weight Bearing Restrictions: No      Mobility Bed Mobility Overal bed mobility: Needs Assistance Bed Mobility: Sidelying to Sit;Sit to Supine   Sidelying to sit: Mod assist;HOB elevated   Sit to supine: Mod assist   General bed mobility comments: mod cues to sequence bridging hips to scoot towards L side of bed, mod A to elevate trunk to sitting and mod A to lift LEs back into bed with poor eccentric control of trunk   Transfers                  General transfer comment: did not test, recommend assessing with assist x2 in future session    Balance Overall balance assessment: Needs assistance Sitting-balance support: Bilateral upper extremity supported;Feet supported Sitting balance-Leahy Scale: Fair Sitting balance - Comments: min guard for safety as patient swaying slightly and closing eyes                                   ADL either performed or assessed with clinical judgement   ADL Overall ADL's : Needs assistance/impaired Eating/Feeding: Set up;Bed level   Grooming: Min guard;Sitting (EOB)   Upper Body Bathing: Minimal assistance;Sitting   Lower Body Bathing: Maximal assistance;Sitting/lateral leans   Upper Body Dressing : Minimal assistance;Sitting   Lower Body Dressing: Maximal assistance;Sitting/lateral leans     Toilet Transfer Details (indicate cue type and reason): did not attempt patient groggy, having difficulty sequencing scooting to edge of bed / head of bed with increased weakness, recommend x2 for safety Toileting- Clothing Manipulation and Hygiene: Bed level;Total assistance         General ADL Comments: patient requiring increased assistance with self care due to weakness, decreased activity tolerance, balance, safety awareness, impaired cognition and pain with sitting upright                   Pertinent Vitals/Pain Pain Assessment: 0-10 Pain Score: 8  Pain Location: incision site Pain Descriptors / Indicators: Sharp Pain Intervention(s): Patient requesting pain meds-RN notified     Hand Dominance Left   Extremity/Trunk Assessment Upper Extremity Assessment Upper Extremity  Assessment: Generalized weakness   Lower Extremity Assessment Lower Extremity Assessment: Defer to PT evaluation;Generalized weakness       Communication Communication Communication: Expressive difficulties;Other (comment) (mumbled speech)   Cognition Arousal/Alertness:  Lethargic Behavior During Therapy: Flat affect Overall Cognitive Status: Impaired/Different from baseline Area of Impairment: Attention;Following commands;Safety/judgement;Problem solving                   Current Attention Level: Sustained;Focused   Following Commands: Follows one step commands with increased time Safety/Judgement: Decreased awareness of safety   Problem Solving: Slow processing;Decreased initiation;Difficulty sequencing;Requires verbal cues;Requires tactile cues General Comments: patient alert/oriented to time, place, situation. difficulty understanding due to soft spoken and mumbled speech. requires mod cues for safety and problem solving during bed mobility   General Comments  pt report dizziness upon sitting up, blood pressure125/63.     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, medical Problem List: Decreased strength;Decreased activity tolerance;Impaired balance (sitting and/or standing);Decreased cognition;Decreased safety awareness;Pain;Obesity      OT Treatment/Interventions: Self-care/ADL training;Therapeutic exercise;DME and/or AE instruction;Therapeutic activities;Cognitive remediation/compensation;Patient/family education;Balance training    OT Goals(Current goals can be found in the care plan section) Acute Rehab OT Goals Patient Stated Goal: "find a place to live" OT Goal Formulation: With patient Time For Goal Achievement: 09/07/19 Potential to Achieve Goals: Good  OT Frequency: Min 3X/week   Barriers to D/C: Inaccessible home environment;Other (comment)  patient homeless, reports has no  friends/family to stay with          AM-PAC OT "6 Clicks" Daily Activity     Outcome Measure Help from another person eating meals?: A Little Help from another person taking care of personal grooming?: A Little Help from another person toileting, which includes using toliet, bedpan, or urinal?: Total Help from another person bathing (including washing, rinsing, drying)?: A Lot Help from another person to put on and taking off regular upper body clothing?: A Little Help from another person to put on and taking off regular lower body clothing?: A Lot 6 Click Score: 14   End of Session Nurse Communication: Mobility status  Activity Tolerance: Patient limited by lethargy;Patient limited by pain Patient left: in bed;with call bell/phone within reach;with nursing/sitter in room  OT Visit Diagnosis: Unsteadiness on feet (R26.81);Other abnormalities of gait and mobility (R26.89);Muscle weakness (generalized) (M62.81);Pain Pain - Right/Left: Right Pain - part of body:  (abdomen)                Time: 0454-0981 OT Time Calculation (min): 24 min Charges:  OT General Charges $OT Visit: 1 Visit OT Evaluation $OT Eval Moderate Complexity: 1 Mod OT Treatments $Self Care/Home Management : 8-22 mins  Delbert Phenix OT Pager: [phone]  Rosemary Holms 08/24/2019, 3:17 PM

## 2019-08-24 NOTE — Progress Notes (Signed)
[facility] Progress Note  Subjective: feeling better, had perc chole tube done yest by IR.  Today creat is down to 5.3 and BUN down to 82.  UOP yest was very good at 2.6 L, and today 1.3 L so far.   Vitals:   08/24/19 0400 08/24/19 0500 08/24/19 0600 08/24/19 0800  BP: (!) 107/56 (!) 112/47 111/61   Pulse: 82 78 79   Resp: (!) 26 17 (!) 22   Temp:    99.3 F (37.4 C)  TempSrc:    Oral  SpO2: 95% 92% 93%   Weight:  72.4 kg    Height:        Exam:  alert, slurred speech, no distress  no jvd  Chest cta bilat  Cor reg no RG  Abd not as distended or tender, softer , still mod distension, decd BS   Ext no LE edema   Alert, NF, ox3    UA 7/12 - hazy, 0-5 epi, 11-20 wbc +clumps, 6-10 rbc   UNa - 36     UCr - pend   CT abd - normal kidneys in appearance, no hydro    Assessment/ Plan: 1. AKI - hemodynamic/ vol depletion +/- ATN due related to abdominal sepsis. Appears to be recovering w/ very good UOP and declining BUN/ Cr. Peak creat was 9.3 on admission 7/12, creat down to 5.3 this am. In recovery phase.  Cont to avoid nephrotoxins/ acei/ arb/ nsaid's/ contrast. Will lower IVF"s to 75 cc/hr for next 2 days. No other suggestions, she should continue to recover. Will sign off, call w/ any questions.  2. Hypokalemia - Kcl in IVF and po Kdur ordered  3. RUQ pain - cholecystitis, acute; is now SP perc tube done 7/14 yesterday 4. Thrombocytopenia - resolved w/ IV abx, plts up 265 5. Bladder mass - not obstructing, urology has seen the patient     Kelly Splinter 08/24/2019, 12:51 PM   Recent Labs  Lab 08/21/19 2057 08/22/19 0541 08/23/19 0239 08/23/19 0239 08/23/19 1859 08/24/19 0228  K 3.2*   < > 2.8*   < > 3.1* 3.5  BUN 97*   < > 92*   < > 94* 82*  CREATININE 8.50*   < > 7.10*   < > 6.01* 5.30*  CALCIUM 7.6*   < > 7.7*   < > 7.8* 7.6*  PHOS 5.4*  --   --   --   --   --   HGB  --    < > 9.5*  --   --  8.5*   < > = values in this interval not displayed.    Inpatient medications: . chlorhexidine  15 mL Mouth Rinse BID  . Chlorhexidine Gluconate Cloth  6 each Topical Daily  . heparin  5,000 Units Subcutaneous Q8H  . mouth rinse  15 mL Mouth Rinse BID  . mouth rinse  15 mL Mouth Rinse q12n4p  . sodium chloride flush  3 mL Intravenous Q12H  . sodium chloride flush  5 mL Intracatheter Q8H   . cefTRIAXone (ROCEPHIN)  IV Stopped (08/23/19 2314)  . dextrose 5 % 1,000 mL with potassium chloride 40 mEq, sodium bicarbonate 100 mEq infusion 125 mL/hr at 08/24/19 0744  . metronidazole Stopped (08/24/19 1110)   acetaminophen **OR** acetaminophen, HYDROmorphone (DILAUDID) injection, nicotine, ondansetron **OR** ondansetron (ZOFRAN) IV

## 2019-08-24 NOTE — Progress Notes (Signed)
PHARMACY - PHYSICIAN COMMUNICATION CRITICAL VALUE ALERT - BLOOD CULTURE IDENTIFICATION (BCID)  Jenna [name] is an 54 y.o. female who presented to [facility] on 08/21/2019 with a chief complaint of abdominal pain  Assessment:  Patient with BCID noted below.   (include suspected source if known)  Name of physician (or Provider) Contacted:   Current antibiotics: Ceftriaxone 2gm iv q24hr  Changes to prescribed antibiotics recommended:  Patient is on recommended antibiotics - No changes needed  Results for orders placed or performed during the hospital encounter of 08/21/19  Blood Culture ID Panel (Reflexed) (Collected: 08/21/2019  2:19 PM)  Result Value Ref Range   Enterococcus species NOT DETECTED NOT DETECTED   Listeria monocytogenes NOT DETECTED NOT DETECTED   Staphylococcus species NOT DETECTED NOT DETECTED   Staphylococcus aureus (BCID) NOT DETECTED NOT DETECTED   Streptococcus species NOT DETECTED NOT DETECTED   Streptococcus agalactiae NOT DETECTED NOT DETECTED   Streptococcus pneumoniae NOT DETECTED NOT DETECTED   Streptococcus pyogenes NOT DETECTED NOT DETECTED   Acinetobacter baumannii NOT DETECTED NOT DETECTED   Enterobacteriaceae species DETECTED (A) NOT DETECTED   Enterobacter cloacae complex NOT DETECTED NOT DETECTED   Escherichia coli NOT DETECTED NOT DETECTED   Klebsiella oxytoca NOT DETECTED NOT DETECTED   Klebsiella pneumoniae NOT DETECTED NOT DETECTED   Proteus species DETECTED (A) NOT DETECTED   Serratia marcescens NOT DETECTED NOT DETECTED   Carbapenem resistance NOT DETECTED NOT DETECTED   Haemophilus influenzae NOT DETECTED NOT DETECTED   Neisseria meningitidis NOT DETECTED NOT DETECTED   Pseudomonas aeruginosa NOT DETECTED NOT DETECTED   Candida albicans NOT DETECTED NOT DETECTED   Candida glabrata NOT DETECTED NOT DETECTED   Candida krusei NOT DETECTED NOT DETECTED   Candida parapsilosis NOT DETECTED NOT DETECTED   Candida tropicalis NOT DETECTED NOT  DETECTED    [name] 08/24/2019  6:54 AM
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
## 2019-08-24 NOTE — Progress Notes (Signed)
Referring Physician(s): Kinsinger, Arta Bruce ([place])  Supervising Physician: Sandi Mariscal  Patient Status:  Jenna [name] - In-pt  Chief Complaint: "Drain pain"  Subjective:  History of acute calculus cholecystitis s/p percutaneous cholecystotomy tube placement in IR 08/23/2019 by Dr. Laurence Ferrari. Patient awake and alert laying in bed. Complains of tenderness at drain insertion site, as expected. Cholecystostomy tube site c/d/i.   Allergies: Penicillins and Sulfa antibiotics  Medications: Prior to Admission medications   Not on File     Vital Signs: BP 111/61   Pulse 79   Temp 98.5 F (36.9 C) (Oral)   Resp (!) 22   Ht 5\' 4"  (1.626 m)   Wt 159 lb 9.8 oz (72.4 kg)   SpO2 93%   BMI 27.40 kg/m   Physical Exam Vitals and nursing note reviewed.  Constitutional:      General: She is not in acute distress.    Appearance: Normal appearance.  Pulmonary:     Effort: Pulmonary effort is normal. No respiratory distress.  Abdominal:     Comments: Cholecystostomy tube with mild tenderness, no erythema, drainage, or active bleeding; approximately 25 cc bile with debris in gravity bag.  Skin:    General: Skin is warm and dry.  Neurological:     Mental Status: She is alert and oriented to person, place, and time.     Imaging: CT ABDOMEN PELVIS WO CONTRAST  Result Date: 08/21/2019 CLINICAL DATA:  Abdominal distension.  RIGHT upper quadrant pain. EXAM: CT ABDOMEN AND PELVIS WITHOUT CONTRAST TECHNIQUE: Multidetector CT imaging of the abdomen and pelvis was performed following the standard protocol without IV contrast. COMPARISON:  None FINDINGS: Lower chest: RIGHT basilar atelectasis with air bronchograms. Small RIGHT effusion. Hepatobiliary: The gallbladder is distended and appears inflamed. There is a large gallstone towards the neck of the gallbladder measuring 1.8 cm in diameter. The gallbladder measures 5.2 cm (image 37/2). No IV contrast makes evaluation of the gallbladder  difficult. There is a portion of the gallbladder fundus which extends along the RIGHT hepatic lobe measuring 2 cm on image 35/2. This either represents contained perforation of the gallbladder or potentially a Phrygian cap. Small amount fluid in Morrison's pouch. Pancreas: Pancreas appears normal.  Inflammation. Spleen: Normal spleen Adrenals/urinary tract: Adrenal glands normal. Kidneys and ureters normal. Along the anterior RIGHT aspect of the bladder there is a intraluminal soft tissue density measuring 3.7 x 3.1 cm (image 73/2). Stomach/Bowel: Stomach, small-bowel and cecum are normal. The appendix is not identified but there is no pericecal inflammation to suggest appendicitis. Some secondary inflammation of the hepatic flexure transverse mesocolon favored related to gallbladder pathology. The colon and rectosigmoid colon are normal. Vascular/Lymphatic: Abdominal aorta is normal caliber. No periportal or retroperitoneal adenopathy. No pelvic adenopathy. Reproductive: Uterus normal. 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 No adnexal abnormality Other: No free fluid. Musculoskeletal: No aggressive osseous lesion. IMPRESSION: 1. Findings most consistent with ACUTE CHOLECYSTITIS. Large gallstone in neck of the gallbladder with distension of the gallbladder and pericholecystic inflammation. Difficult to tell if there is contained perforation of the fundus of the gallbladder or a distended Phrygian cap (noncontrast exam). Favors distended Phrygian cap. 2. Minimal intraperitoneal free fluid. Small amount of fluid in Morison's pouch. 3. RIGHT basilar atelectasis.  Pneumonia less favored. 4. Inflammation of the transverse mesocolon favored related to cholecystitis. 5. 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 Recommend urology consultation. Electronically Signed   By: Suzy Bouchard M.D.   On: 08/21/2019 16:18  CT HEAD WO CONTRAST  Result Date: 08/24/2019 CLINICAL DATA:  Speech  difficulty. EXAM: CT HEAD WITHOUT CONTRAST TECHNIQUE: Contiguous axial images were obtained from the base of the skull through the vertex without intravenous contrast. COMPARISON:  CT head 08/21/2019 FINDINGS: Brain: No evidence of acute infarction, hemorrhage, hydrocephalus, extra-axial collection or mass lesion/mass effect. Vascular: Negative for hyperdense vessel Skull: Negative Sinuses/Orbits: Negative Other: None IMPRESSION: Negative CT head. Electronically Signed   By: Franchot Gallo M.D.   On: 08/24/2019 13:31   CT Head Wo Contrast  Result Date: 08/21/2019 CLINICAL DATA:  Altered mental status, unclear cause. Additional history provided: Weakness, lethargy, low blood pressure. EXAM: CT HEAD WITHOUT CONTRAST TECHNIQUE: Contiguous axial images were obtained from the base of the skull through the vertex without intravenous contrast. COMPARISON:  No pertinent prior studies available for comparison. FINDINGS: Brain: Cerebral volume is normal for age. There is no acute intracranial hemorrhage. No demarcated cortical infarct. No extra-axial fluid collection. No evidence of intracranial mass. No midline shift. Vascular: No hyperdense vessel. Skull: Normal. Negative for fracture or focal lesion. Sinuses/Orbits: Visualized orbits show no acute finding. No significant paranasal sinus disease or mastoid effusion at the imaged levels. IMPRESSION: Unremarkable non-contrast CT appearance of the brain for age. No evidence of acute intracranial abnormality. Electronically Signed   By: Kellie Simmering DO   On: 08/21/2019 15:51   US RENAL  Result Date: 08/22/2019 CLINICAL DATA:  AKI EXAM: RENAL / URINARY TRACT ULTRASOUND COMPLETE COMPARISON:  None. FINDINGS: Right Kidney: Renal measurements: 12.3 x 5.4 x 6.2 cm = volume: 213 mL . Echogenicity within normal limits. No mass or hydronephrosis visualized. Left Kidney: Renal measurements: 12.9 x 5.7 x 5.3 cm = volume: 204 mL. Echogenicity within normal limits. No mass or  hydronephrosis visualized. Bladder: Contracted around Foley catheter. There is an approximately 5.1 x 2.9 x 5.2 cm heterogeneous mass with flow on color Doppler. Other: Incompletely evaluated gallstones, sludge, wall thickening, and pericholecystic fluid reflecting known cholecystitis. IMPRESSION: Contracted bladder.  Approximately 5 cm heterogeneous mass. Electronically Signed   By: Macy Mis M.D.   On: 08/22/2019 12:29   IR Perc Cholecystostomy  Result Date: 08/23/2019 INDICATION: 54 year old female with acute calculus cholecystitis. She is currently too ill to undergo cholecystectomy. EXAM: CHOLECYSTOSTOMY MEDICATIONS: In patient status, currently receiving intravenous antibiotics. ANESTHESIA/SEDATION: Moderate (conscious) sedation was employed during this procedure. A total of Versed 2 mg and Fentanyl 100 mcg was administered intravenously. Moderate Sedation Time: 10 minutes. The patient's level of consciousness and vital signs were monitored continuously by radiology nursing throughout the procedure under my direct supervision. FLUOROSCOPY TIME:  Fluoroscopy Time: 0 minutes 36 seconds (6 mGy). COMPLICATIONS: None immediate. PROCEDURE: Informed written consent was obtained from the patient after a thorough discussion of the procedural risks, benefits and alternatives. All questions were addressed. Maximal Sterile Barrier Technique was utilized including caps, mask, sterile gowns, sterile gloves, sterile drape, hand hygiene and skin antiseptic. A timeout was performed prior to the initiation of the procedure. The right upper quadrant was interrogated with ultrasound. The abnormal gallbladder was successfully identified. A suitable skin entry site was selected and marked. Local anesthesia was attained by infiltration with 1% lidocaine. A small dermatotomy was made. Under real-time ultrasound guidance, a 21 gauge Chiba needle was used to puncture the gallbladder after passing through a short transhepatic  tract. A 0.018 wire was then coiled in the gallbladder lumen. The needle was exchanged for the Accustick sheath which was advanced over the wire and  formed in the gallbladder lumen. Contrast injection confirms the sheath location within the gallbladder. The transitional sheath was then exchanged over a 0.035 Amplatz wire. The skin tract was dilated to 10 [place]. A flex month 10.2 [place] all-purpose drainage catheter was then advanced over the wire and formed. Aspiration yields foul-smelling purulent bile. A sample was sent for Gram stain and culture. The catheter was connected to gravity bag drainage and secured to the skin with 0 Prolene suture. Sterile bandages were placed. IMPRESSION: Successful transhepatic percutaneous cholecystostomy tube placement for an indication of acute calculus cholecystitis. Electronically Signed   By: Jacqulynn Cadet M.D.   On: 08/23/2019 13:19    Labs:  CBC: Recent Labs    08/21/19 1250 08/22/19 0541 08/23/19 0239 08/24/19 0228  WBC 12.7* 14.8* 18.8* 16.7*  HGB 11.0* 10.0* 9.5* 8.5*  HCT 32.0* 29.1* 28.1* 25.2*  PLT 115* 142* 239 300    COAGS: Recent Labs    08/21/19 1250 08/22/19 0541  INR 1.1 1.1  APTT 26  --     BMP: Recent Labs    08/22/19 0541 08/23/19 0239 08/23/19 1859 08/24/19 0228  NA 132* 138 136 136  K 3.2* 2.8* 3.1* 3.5  CL 96* 104 101 101  CO2 18* 16* 18* 22  GLUCOSE 152* 106* 327* 181*  BUN 117* 92* 94* 82*  CALCIUM 7.5* 7.7* 7.8* 7.6*  CREATININE 8.32* 7.10* 6.01* 5.30*  GFRNONAA 5* 6* 7* 9*  GFRAA 6* 7* 9* 10*    LIVER FUNCTION TESTS: Recent Labs    08/21/19 1250 08/22/19 0541 08/23/19 0239  BILITOT 0.7 0.8 0.5  AST 32 29 20  ALT 29 21 17   ALKPHOS 104 88 83  PROT 7.4 5.8* 5.5*  ALBUMIN 2.7* 2.1* 1.9*    Assessment and Plan:  History of acute calculus cholecystitis s/p percutaneous cholecystotomy tube placement in IR 08/23/2019 by Dr. Laurence Ferrari. Cholecystostomy tube stable with approximately 25 cc bile  with debris in gravity bag (additional 25 cc output from drain in past 24 hours per chart). Continue current drain management- continue with Qshift flushes/monitor of output. Further plans per [facility]/urology- appreciate and agree with management. IR to follow.   Electronically Signed: Earley Abide, PA-C 08/24/2019, 2:00 PM   I spent a total of 25 Minutes at the the patient's bedside AND on the patient's hospital floor or unit, greater than 50% of which was counseling/coordinating care for acute calculus cholecystitis s/p cholecystotomy tube placement.

## 2019-08-24 NOTE — Progress Notes (Signed)
PROGRESS NOTE  Jenna [name] SWF:[id number] DOB: 04/28/1965 DOA: 08/21/2019 PCP: Patient, No Pcp Per  Brief History   Jenna [name] is a 54 y.o. female with no regular medical follow up on no medications who presented to the ED by EMS 7/12 when she was found lethargic on a bed of her motel lethargic with RUQ abdominal pain, N/V, no po intake for days, found to be hypotensive treated with IVF. In the ED hypotension had improved to 104/74. She remained lethargic but oriented, with slurred speech and poor historian, peritoneal signs on exam. Found to have acute renal failure with imaging confirming acute cholecystitis due to cholelithiasis in gallbladder neck. Bladder mass was also noted for which urology is consulted. Multiple metabolic derangements indicating acute renal failure with hypokalemia, elevated anion gap with bicarb 20. WBC 12.7k (PMN-predominant), mild anemia with severe microcytosis/hypochromia and thrombocytopenia at 115k. Lactic acid was normal. LFT's were also normal. CT abdomen/pelvis demonstrated findings consistent with acute cholecystitis with possible perforation, bladder mass without hydronephrosis or urolithiasis. CT head nonacute. General surgery, nephrology, and urology were consulted. IV fluids given, though patient remains lethargic. The plan is for interventional radiology to place percutaneous cholecystostomy tube on the afternoon of 08/23/2019. Urology has determined that the bladder mass is concerning for a urothelial carcinoma. They plan to schedule the patient for an outpatient TURBT once her cholecystitis has been addressed.  This morning the patient is much more alert. I also learn this morning that the patient is having difficulty with speech. She states that it is primarily difficulty in getting her words out, but it seems also to be due to difficulty with word finding. Nursing advises me that the patient's boyfriend had reported that this was going on since the night  of Friday, August 18 2019. The patient has been sent for a stat CT of the head. I have attempted to reach the patient's boyfriend, Jenna [name], but I have been unable to reach him by phone.  Consultants  . Interventional Radiology . General Surgery . Nephrology . Urology  Procedures  . None  Antibiotics   Anti-infectives (From admission, onward)   Start     Dose/Rate Route Frequency Ordered Stop   08/22/19 0200  metroNIDAZOLE (FLAGYL) IVPB 500 mg     Discontinue     500 mg 100 mL/hr over 60 Minutes Intravenous Every 8 hours 08/21/19 1810     08/21/19 2200  cefTRIAXone (ROCEPHIN) 2 g in sodium chloride 0.9 % 100 mL IVPB     Discontinue     2 g 200 mL/hr over 30 Minutes Intravenous Every 24 hours 08/21/19 1813     08/21/19 1645  ceFEPIme (MAXIPIME) 2 g in sodium chloride 0.9 % 100 mL IVPB        2 g 200 mL/hr over 30 Minutes Intravenous  Once 08/21/19 1631 08/21/19 1730   08/21/19 1645  metroNIDAZOLE (FLAGYL) IVPB 500 mg        500 mg 100 mL/hr over 60 Minutes Intravenous  Once 08/21/19 1631 08/21/19 1815      Subjective  The patient is awake and alert. She is complaining of difficulty speaking. She sates that she started having difficult with speech on Friday, August 18 2019, PTA. Per nursing this is corroborated by the patient's boyfriend.  Objective   Vitals:  Vitals:   08/24/19 0600 08/24/19 0800  BP: 111/61   Pulse: 79   Resp: (!) 22   Temp:  99.3 F (37.4 C)  SpO2:  93%     Exam:  Constitutional:  . The patient is awake and alert. Speech is noticeably slurred. Some difficulty with word finding as well. Respiratory:  . No increased work of breathing. . No wheezes, rales, or rhonchi . No tactile fremitus Cardiovascular:  . Regular rate and rhythm . No murmurs, ectopy, or gallups. . No lateral PMI. No thrills. Abdomen:  . Abdomen is diffusely distended and tender. . No hernias, masses, or organomegaly . Hypoactive bowel sounds.  Musculoskeletal:  . No  cyanosis, clubbing, or edema Skin:  . No rashes, lesions, ulcers . palpation of skin: no induration or nodules Neurologic:  . Pt is awake, alert, and oriented x 3. Psychiatric:  . Pt is unable to cooperate with exam.  I have personally reviewed the following:   Today's Data  . Vitals, BMP, CBC  Micro Data  . Urine culture: Polymicrobial.  Imaging  . Renal ultrasound  Scheduled Meds: . chlorhexidine  15 mL Mouth Rinse BID  . Chlorhexidine Gluconate Cloth  6 each Topical Daily  . heparin  5,000 Units Subcutaneous Q8H  . mouth rinse  15 mL Mouth Rinse BID  . mouth rinse  15 mL Mouth Rinse q12n4p  . sodium chloride flush  3 mL Intravenous Q12H  . sodium chloride flush  5 mL Intracatheter Q8H   Continuous Infusions: . cefTRIAXone (ROCEPHIN)  IV Stopped (08/23/19 2314)  . dextrose 5 % 1,000 mL with potassium chloride 40 mEq, sodium bicarbonate 100 mEq infusion 125 mL/hr at 08/24/19 0744  . metronidazole 500 mg (08/24/19 1010)    Principal Problem:   ARF (acute renal failure) (HCC) Active Problems:   Acute calculous cholecystitis   Bladder mass   Increased anion gap metabolic acidosis   Hypokalemia   LOS: 3 days   A & P  Acuterenal failure: Dehydration/prerenal suspected, though lack of significant improvement with IVF and rising BUN would suggest sepsis as primary driver. Repeat bolus and increased LR rate. Urine output is improved as charted. I appreciate nephrology's assistance. Monitor creatinine, electrolytes, and volume status. Avoid nephrotoxins and hypotension.  Dysarthria and word finding difficulty: Nursing tells me that the patient's boyfriend had reported that this has been going on since Friday, August 18, 2019. It is just now being noticed as the patient has been lethargic and not very verbal until this morning. Stat CT head has been ordered and will be followed by a complete stroke work up and SLP, PT, and OT.  Acute calculous cholecystitis without biliary  obstruction: LFTs wnl. Possible contained perforation vs. Phrygian cap on non-contrast CT. the patient's abdomen is distended on exam, but does not appear to be an acute abdodmen. No peritoneal signs. Interventional radiology performed transcutaneous placement of a cholecystostomy tube. The patient is receiving ceftriaxone and flagyl. The blood cultures (1) collected on 08/21/2019 has grown out enterbactereacea species. She is receiving ceftriaxone to which the organism should be sensitive.  Bacteremia: The blood cultures (1) collected on 08/21/2019 has grown out enterbactereacea species. She is receiving ceftriaxone to which the organism should be sensitive. Will repeat cultures and await sensitivities.   Bladder mass: Seen on CT abd/pelvis 3.7cm - 3.1cm intraluminal soft tissue density. Urology has been consulted. Will need eventual TURBT. Dr. Gilford Rile plans to schedule as outpatient after the patient has recovered from biliary illness and acute renal failure.  Anion gap metabolic acidosis: No evidence of ingestion, lactic acid normal and no ketonuria. Due to acute kidney injury. Monitor CO2. Consider oral sodium  bicarbonate.  Hyponatremia: Resolved. Sodium is 136 this morning. Monitor and continue IV fluids as above.  Hypokalemia: Resolved. K down to 2.8 again this am. Supplement and monitor. Continue to monitor the patient on telemetry. Will also check magnesium.  Thrombocytopenia: Resolved. Many abnormalities on smear including schistocytes though normal bilirubin argues against hemolysis. TTP felt to be less likely than sepsis as cause. Platelets are 300 this morning. Likely reactive due to sepsis.  Acute uremic encephalopathy: Much more alert this morning. Now concerned that lethargy may have been due to intracranial event given speech difficulties. Continue to monitor.  I have seen and examined this patient myself. I have spent 38 minutes in her evaluation and care.  DVT prophylaxis:  Heparin Code Status: Full Family Communication: None at bedside Disposition Plan:  Status is: Inpatient  Remains inpatient appropriate because:Hemodynamically unstable, Persistent severe electrolyte disturbances, Altered mental status, IV treatments appropriate due to intensity of illness or inability to take PO and Inpatient level of care appropriate due to severity of illness  Dispo: The patient is from: Home  Anticipated d/c is to: TBD. PT/OT has been ordered.  Anticipated d/c date is: > 3 days  Patient currently is not medically stable to d/c.  Cleland Simkins, DO [facility] Direct contact: see [email or web address]  7PM-7AM contact night coverage as above 08/24/2019, 1:38 PM  LOS: 2 days

## 2019-08-25 ENCOUNTER — Inpatient Hospital Stay (HOSPITAL_COMMUNITY): Payer: Self-pay

## 2019-08-25 LAB — BASIC METABOLIC PANEL
Anion gap: 14 (ref 5–15)
BUN: 66 mg/dL — ABNORMAL HIGH (ref 6–20)
CO2: 27 mmol/L (ref 22–32)
Calcium: 8 mg/dL — ABNORMAL LOW (ref 8.9–10.3)
Chloride: 101 mmol/L (ref 98–111)
Creatinine, Ser: 3.86 mg/dL — ABNORMAL HIGH (ref 0.44–1.00)
GFR calc Af Amer: 15 mL/min — ABNORMAL LOW (ref >60)
GFR calc non Af Amer: 13 mL/min — ABNORMAL LOW (ref >60)
Glucose, Bld: 137 mg/dL — ABNORMAL HIGH (ref 70–99)
Potassium: 3.4 mmol/L — ABNORMAL LOW (ref 3.5–5.1)
Sodium: 142 mmol/L (ref 135–145)

## 2019-08-25 LAB — GLUCOSE, CAPILLARY
Glucose-Capillary: 112 mg/dL — ABNORMAL HIGH (ref 70–99)
Glucose-Capillary: 113 mg/dL — ABNORMAL HIGH (ref 70–99)
Glucose-Capillary: 187 mg/dL — ABNORMAL HIGH (ref 70–99)
Glucose-Capillary: 213 mg/dL — ABNORMAL HIGH (ref 70–99)
Glucose-Capillary: 234 mg/dL — ABNORMAL HIGH (ref 70–99)

## 2019-08-25 NOTE — Progress Notes (Signed)
Referring Physician(s): Kinsinger,L  Supervising Physician: Arne Cleveland  Patient Status:  [name] - In-pt  Chief Complaint:  Abdominal pain/cholecysitis  Subjective: Patient continues to have some upper abdominal discomfort; had some nausea earlier but now resolved.  Currently resting quietly.  MRI brain today negative.   Allergies: Penicillins and Sulfa antibiotics  Medications: Prior to Admission medications   Not on File     Vital Signs: BP 119/72   Pulse 65   Temp 98.7 F (37.1 C) (Axillary)   Resp 16   Ht 5\' 4"  (1.626 m)   Wt 155 lb 10.3 oz (70.6 kg)   SpO2 97%   BMI 26.72 kg/m   Physical Exam Sleepy but arousable.  Gallbladder drain intact, insertion site okay, mildly tender to palpation.  Output 40 cc of turbid yellow bile; drain irrigated without difficulty.  Imaging: CT ABDOMEN PELVIS WO CONTRAST  Result Date: 08/21/2019 CLINICAL DATA:  Abdominal distension.  RIGHT upper quadrant pain. EXAM: CT ABDOMEN AND PELVIS WITHOUT CONTRAST TECHNIQUE: Multidetector CT imaging of the abdomen and pelvis was performed following the standard protocol without IV contrast. COMPARISON:  None FINDINGS: Lower chest: RIGHT basilar atelectasis with air bronchograms. Small RIGHT effusion. Hepatobiliary: The gallbladder is distended and appears inflamed. There is a large gallstone towards the neck of the gallbladder measuring 1.8 cm in diameter. The gallbladder measures 5.2 cm (image 37/2). No IV contrast makes evaluation of the gallbladder difficult. There is a portion of the gallbladder fundus which extends along the RIGHT hepatic lobe measuring 2 cm on image 35/2. This either represents contained perforation of the gallbladder or potentially a Phrygian cap. Small amount fluid in Morrison's pouch. Pancreas: Pancreas appears normal.  Inflammation. Spleen: Normal spleen Adrenals/urinary tract: Adrenal glands normal. Kidneys and ureters normal. Along the anterior RIGHT aspect of the  bladder there is a intraluminal soft tissue density measuring 3.7 x 3.1 cm (image 73/2). Stomach/Bowel: Stomach, small-bowel and cecum are normal. The appendix is not identified but there is no pericecal inflammation to suggest appendicitis. Some secondary inflammation of the hepatic flexure transverse mesocolon favored related to gallbladder pathology. The colon and rectosigmoid colon are normal. Vascular/Lymphatic: Abdominal aorta is normal caliber. No periportal or retroperitoneal adenopathy. No pelvic adenopathy. Reproductive: Uterus normal. 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 No adnexal abnormality Other: No free fluid. Musculoskeletal: No aggressive osseous lesion. IMPRESSION: 1. Findings most consistent with ACUTE CHOLECYSTITIS. Large gallstone in neck of the gallbladder with distension of the gallbladder and pericholecystic inflammation. Difficult to tell if there is contained perforation of the fundus of the gallbladder or a distended Phrygian cap (noncontrast exam). Favors distended Phrygian cap. 2. Minimal intraperitoneal free fluid. Small amount of fluid in Morison's pouch. 3. RIGHT basilar atelectasis.  Pneumonia less favored. 4. Inflammation of the transverse mesocolon favored related to cholecystitis. 5. 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 Recommend urology consultation. Electronically Signed   By: Suzy Bouchard M.D.   On: 08/21/2019 16:18   CT HEAD WO CONTRAST  Result Date: 08/24/2019 CLINICAL DATA:  Speech difficulty. EXAM: CT HEAD WITHOUT CONTRAST TECHNIQUE: Contiguous axial images were obtained from the base of the skull through the vertex without intravenous contrast. COMPARISON:  CT head 08/21/2019 FINDINGS: Brain: No evidence of acute infarction, hemorrhage, hydrocephalus, extra-axial collection or mass lesion/mass effect. Vascular: Negative for hyperdense vessel Skull: Negative Sinuses/Orbits: Negative Other: None IMPRESSION: Negative CT head.  Electronically Signed   By: Franchot Gallo M.D.   On: 08/24/2019 13:31  CT Head Wo Contrast  Result Date: 08/21/2019 CLINICAL DATA:  Altered mental status, unclear cause. Additional history provided: Weakness, lethargy, low blood pressure. EXAM: CT HEAD WITHOUT CONTRAST TECHNIQUE: Contiguous axial images were obtained from the base of the skull through the vertex without intravenous contrast. COMPARISON:  No pertinent prior studies available for comparison. FINDINGS: Brain: Cerebral volume is normal for age. There is no acute intracranial hemorrhage. No demarcated cortical infarct. No extra-axial fluid collection. No evidence of intracranial mass. No midline shift. Vascular: No hyperdense vessel. Skull: Normal. Negative for fracture or focal lesion. Sinuses/Orbits: Visualized orbits show no acute finding. No significant paranasal sinus disease or mastoid effusion at the imaged levels. IMPRESSION: Unremarkable non-contrast CT appearance of the brain for age. No evidence of acute intracranial abnormality. Electronically Signed   By: Kellie Simmering DO   On: 08/21/2019 15:51   MR BRAIN WO CONTRAST  Result Date: 08/25/2019 CLINICAL DATA:  Acute neuro deficit. EXAM: MRI HEAD WITHOUT CONTRAST TECHNIQUE: Multiplanar, multiecho pulse sequences of the brain and surrounding structures were obtained without intravenous contrast. COMPARISON:  CT head 08/24/2019 FINDINGS: Brain: No acute infarction, hemorrhage, hydrocephalus, extra-axial collection or mass lesion. Vascular: Normal arterial flow voids Skull and upper cervical spine: No focal skeletal lesion. Sinuses/Orbits: Paranasal sinuses clear.  Negative orbit Other: None IMPRESSION: Negative MRI head without contrast. Electronically Signed   By: Franchot Gallo M.D.   On: 08/25/2019 10:59   US RENAL  Result Date: 08/22/2019 CLINICAL DATA:  AKI EXAM: RENAL / URINARY TRACT ULTRASOUND COMPLETE COMPARISON:  None. FINDINGS: Right Kidney: Renal measurements: 12.3 x 5.4 x  6.2 cm = volume: 213 mL . Echogenicity within normal limits. No mass or hydronephrosis visualized. Left Kidney: Renal measurements: 12.9 x 5.7 x 5.3 cm = volume: 204 mL. Echogenicity within normal limits. No mass or hydronephrosis visualized. Bladder: Contracted around Foley catheter. There is an approximately 5.1 x 2.9 x 5.2 cm heterogeneous mass with flow on color Doppler. Other: Incompletely evaluated gallstones, sludge, wall thickening, and pericholecystic fluid reflecting known cholecystitis. IMPRESSION: Contracted bladder.  Approximately 5 cm heterogeneous mass. Electronically Signed   By: Macy Mis M.D.   On: 08/22/2019 12:29   IR Perc Cholecystostomy  Result Date: 08/23/2019 INDICATION: 54 year old female with acute calculus cholecystitis. She is currently too ill to undergo cholecystectomy. EXAM: CHOLECYSTOSTOMY MEDICATIONS: In patient status, currently receiving intravenous antibiotics. ANESTHESIA/SEDATION: Moderate (conscious) sedation was employed during this procedure. A total of Versed 2 mg and Fentanyl 100 mcg was administered intravenously. Moderate Sedation Time: 10 minutes. The patient's level of consciousness and vital signs were monitored continuously by radiology nursing throughout the procedure under my direct supervision. FLUOROSCOPY TIME:  Fluoroscopy Time: 0 minutes 36 seconds (6 mGy). COMPLICATIONS: None immediate. PROCEDURE: Informed written consent was obtained from the patient after a thorough discussion of the procedural risks, benefits and alternatives. All questions were addressed. Maximal Sterile Barrier Technique was utilized including caps, mask, sterile gowns, sterile gloves, sterile drape, hand hygiene and skin antiseptic. A timeout was performed prior to the initiation of the procedure. The right upper quadrant was interrogated with ultrasound. The abnormal gallbladder was successfully identified. A suitable skin entry site was selected and marked. Local anesthesia was  attained by infiltration with 1% lidocaine. A small dermatotomy was made. Under real-time ultrasound guidance, a 21 gauge Chiba needle was used to puncture the gallbladder after passing through a short transhepatic tract. A 0.018 wire was then coiled in the gallbladder lumen. The needle was exchanged for the  Accustick sheath which was advanced over the wire and formed in the gallbladder lumen. Contrast injection confirms the sheath location within the gallbladder. The transitional sheath was then exchanged over a 0.035 Amplatz wire. The skin tract was dilated to 10 [place]. A flex month 10.2 [place] all-purpose drainage catheter was then advanced over the wire and formed. Aspiration yields foul-smelling purulent bile. A sample was sent for Gram stain and culture. The catheter was connected to gravity bag drainage and secured to the skin with 0 Prolene suture. Sterile bandages were placed. IMPRESSION: Successful transhepatic percutaneous cholecystostomy tube placement for an indication of acute calculus cholecystitis. Electronically Signed   By: Jacqulynn Cadet M.D.   On: 08/23/2019 13:19    Labs:  CBC: Recent Labs    08/21/19 1250 08/22/19 0541 08/23/19 0239 08/24/19 0228  WBC 12.7* 14.8* 18.8* 16.7*  HGB 11.0* 10.0* 9.5* 8.5*  HCT 32.0* 29.1* 28.1* 25.2*  PLT 115* 142* 239 300    COAGS: Recent Labs    08/21/19 1250 08/22/19 0541  INR 1.1 1.1  APTT 26  --     BMP: Recent Labs    08/23/19 0239 08/23/19 1859 08/24/19 0228 08/25/19 0233  NA 138 136 136 142  K 2.8* 3.1* 3.5 3.4*  CL 104 101 101 101  CO2 16* 18* 22 27  GLUCOSE 106* 327* 181* 137*  BUN 92* 94* 82* 66*  CALCIUM 7.7* 7.8* 7.6* 8.0*  CREATININE 7.10* 6.01* 5.30* 3.86*  GFRNONAA 6* 7* 9* 13*  GFRAA 7* 9* 10* 15*    LIVER FUNCTION TESTS: Recent Labs    08/21/19 1250 08/22/19 0541 08/23/19 0239  BILITOT 0.7 0.8 0.5  AST 32 29 20  ALT 29 21 17   ALKPHOS 104 88 83  PROT 7.4 5.8* 5.5*  ALBUMIN 2.7* 2.1* 1.9*      Assessment and Plan: Patient with history of acute calculus cholecystitis, poor operative candidate; status post percutaneous cholecystostomy on 7/14; afebrile, creatinine 3.86 down from 5.3, bile cultures growing Proteus; brain MRI today negative; gallbladder drain will need to remain in place at least 6 weeks unless cholecystectomy done in the interim; follow-up cholangiogram in 6 to 8 weeks; continue drain irrigation 3 times daily as inpatient and once daily as outpatient with 5 cc saline; additional plans as per CCS/TRH/renal and urology.   Electronically Signed: D. Rowe Robert, PA-C 08/25/2019, 2:25 PM   I spent a total of 15 minutes at the the patient's bedside AND on the patient's hospital floor or unit, greater than 50% of which was counseling/coordinating care for gallbladder drain    Patient ID: Jenna [name], female   DOB: 1965-04-24, 54 y.o.   MRN: [id number]

## 2019-08-25 NOTE — Progress Notes (Signed)
   08/25/19 1821  General Information  Temperature Spikes Noted No  Respiratory Status Room air  Oral Cavity - Dentition Missing dentition;Poor condition  Patient observed directly with PO's Yes  Type of PO's observed Thin liquids (pt observed consuming Boost Breeze only as she declined to try solids stating they make her "sick on her stomach")-- ? If her gallbladder issue is contributing to nausea  Feeding Able to feed self  Liquids provided via Straw;Cup  Treatment Provided  Treatment provided Dysphagia  Dysphagia Treatment  Treatment Methods Skilled observation;Compensation strategy training;Patient/caregiver education  Family/Caregiver Educated pt  Other treatment/comments Pt seen for skilled dysphagia treatment to assure po tolerance and to educate her to precautions.  She was observed to naturally take small boluses with no indication of airway compromise consistent with coughing, throat clearing, etc.     Slow responses with intermittent initial sound repetition, dysarthria and ? Word finding deficits.  Pt reports component of both word finding and articulation difficulties.  With extra time, she is able to articulate adequately.     Pt admits she has not had a bowel movement in several days and reports normally she moves her bowels am and pm - advised RN to pt statement.  ? If this may decrease her desire for po also?   Recommend continue soft/thin diet - with strict reflux precautions.  Encouraged her to consume the [facility] SLP brought to her for protein as her tray included only carbs.    Pt states she had pill dysphagia - reporting it lodged in her throat on the left side when taken with applesauce- liquid swallow was then regurgitated per pt. RN denies pt having any po medications today.  Pt also states she got sick when eating solids - and being moved in bed.   She denies issues with swallowing the food, rather vomiting after with bed mobility.   SLP will follow up x1 more  to assure tolerance and to assure pt without a significant pharyngeal dysphagia.    Recommend consider a speech/language evaluation due to pt's dysarthria and questionable word finding deficit. Pt informed and agreeable.   Kathleen Lime, MS [place] Office [phone]

## 2019-08-25 NOTE — Progress Notes (Signed)
Progress Note: General Surgery Service   Chief Complaint/Subjective: Tolerated swallow and ate some food overnight, minimal abdominal pain  Objective: Vital signs in last 24 hours: Temp:  [98.4 F (36.9 C)-99.4 F (37.4 C)] 98.4 F (36.9 C) (07/16 0357) Pulse Rate:  [61-79] 71 (07/16 0600) Resp:  [14-29] 16 (07/16 0600) BP: (91-138)/(53-76) 105/55 (07/16 0600) SpO2:  [86 %-96 %] 89 % (07/16 0600) Weight:  [70.6 kg] 70.6 kg (07/16 0500) Last BM Date: 08/21/19  Intake/Output from previous day: 07/15 0701 - 07/16 0700 In: 1220.7 [I.V.:1011.9; IV Piggyback:198.8] Out: 3040 [Urine:3000; Drains:40] Intake/Output this shift: No intake/output data recorded.  Gen: NAD  Resp: nonlabored  Card: RRR  Abd: soft, drain with minimal output, mild tenderness on RUQ palpation  Lab Results: CBC  Recent Labs    08/23/19 0239 08/24/19 0228  WBC 18.8* 16.7*  HGB 9.5* 8.5*  HCT 28.1* 25.2*  PLT 239 300   BMET Recent Labs    08/24/19 0228 08/25/19 0233  NA 136 142  K 3.5 3.4*  CL 101 101  CO2 22 27  GLUCOSE 181* 137*  BUN 82* 66*  CREATININE 5.30* 3.86*  CALCIUM 7.6* 8.0*   PT/INR No results for input(s): LABPROT, INR in the last 72 hours. ABG No results for input(s): PHART, HCO3 in the last 72 hours.  Invalid input(s): PCO2, PO2  Anti-infectives: Anti-infectives (From admission, onward)   Start     Dose/Rate Route Frequency Ordered Stop   08/22/19 0200  metroNIDAZOLE (FLAGYL) IVPB 500 mg     Discontinue     500 mg 100 mL/hr over 60 Minutes Intravenous Every 8 hours 08/21/19 1810     08/21/19 2200  cefTRIAXone (ROCEPHIN) 2 g in sodium chloride 0.9 % 100 mL IVPB     Discontinue     2 g 200 mL/hr over 30 Minutes Intravenous Every 24 hours 08/21/19 1813     08/21/19 1645  ceFEPIme (MAXIPIME) 2 g in sodium chloride 0.9 % 100 mL IVPB        2 g 200 mL/hr over 30 Minutes Intravenous  Once 08/21/19 1631 08/21/19 1730   08/21/19 1645  metroNIDAZOLE (FLAGYL) IVPB 500 mg         500 mg 100 mL/hr over 60 Minutes Intravenous  Once 08/21/19 1631 08/21/19 1815      Medications: Scheduled Meds: . chlorhexidine  15 mL Mouth Rinse BID  . Chlorhexidine Gluconate Cloth  6 each Topical Daily  . heparin  5,000 Units Subcutaneous Q8H  . mouth rinse  15 mL Mouth Rinse BID  . mouth rinse  15 mL Mouth Rinse q12n4p  . sodium chloride flush  3 mL Intravenous Q12H  . sodium chloride flush  5 mL Intracatheter Q8H   Continuous Infusions: . cefTRIAXone (ROCEPHIN)  IV Stopped (08/24/19 2333)  . metronidazole Stopped (08/25/19 0406)   PRN Meds:.acetaminophen **OR** acetaminophen, HYDROmorphone (DILAUDID) injection, nicotine, ondansetron **OR** ondansetron (ZOFRAN) IV  Assessment/Plan: 54 yo female with cholecystitis and bacteremia s/p perc chole drain. ARF with improving Cr -continue antibiotics -continue diet -continue drain care   LOS: 4 days   Mickeal Skinner, MD [facility]

## 2019-08-25 NOTE — Plan of Care (Addendum)
Called by Dr. Benny Lennert for concern of stroke due to dysarthria in this patient. Recommend MRI (CT negative). LKW last Friday. Severely deranged renal function, and only dysarthria and lethargy (non specific and non focal neurolgoical signs) are not a certainty for stroke, so MRI would be helpful. Please call when MRI is completed and will formally consult if positive for stroke. Timing of LKW puts her outside window for tpa or intervention.  -- Amie Portland, MD [facility] Pager: [phone] If 7pm to 7am, please call on call as listed on AMION.   Addendum-12:45 PM MRI brain completed and reviewed-negative for stroke. Relayed results to Dr. Benny Lennert. Continue metabolic work-up and management as you are. Please call with questions  -- Amie Portland, MD [facility] Pager: [phone] If 7pm to 7am, please call on call as listed on AMION.

## 2019-08-25 NOTE — Evaluation (Signed)
Physical Therapy Evaluation Patient Details Name: Jenna [name] MRN: [id number] DOB: 12-27-1965 Today's Date: 08/25/2019   History of Present Illness  Patient is a 54 year old female admitted 08/21/19 with lethargy, abdominal pain, passing out. Severe renal failure, with cholecystitis, bladder tumor and large gallstone found.  PMH: depression, anxiety. 08/23/19 percutaneous cholecystostomy tube placement   Clinical Impression  The patient's speech continues to be slow and slurred and difficulty to understand. Patient reports feeling dizzy upon sitting up. While sitting, keeps eyes closed mostly, noted trunk sway and  Some jerking.Patint's BP 127/74. Pt admitted with above diagnosis. Pt currently with functional limitations due to the deficits listed below (see PT Problem List). Pt will benefit from skilled PT to increase their independence and safety with mobility to allow discharge to the venue listed below.   Did not attempt to stand with only 1 assist for safety due to patient's decreased arousal.    Follow Up Recommendations SNF;Supervision/Assistance - 24 hour    Equipment Recommendations   (tba)    Recommendations for Other Services       Precautions / Restrictions Precautions Precautions: Fall Precaution Comments: R perc chole tube       Mobility  Bed Mobility Overal bed mobility: Needs Assistance Bed Mobility: Sidelying to Sit;Rolling Rolling: Min assist Sidelying to sit: Mod assist   Sit to supine: Mod assist   General bed mobility comments: assist to roll, patient assisted legs to bed edge ,mod A for trunk to sit upright, cues to push from LUE from side, extra time to get legs back onto bed,  Transfers                 General transfer comment: did not test, recommend assessing with assist x2 in future session, patient sitting with eyes closd, reporting dizziness  Ambulation/Gait             General Gait Details: TBA  Stairs             Wheelchair Mobility    Modified Rankin (Stroke Patients Only)       Balance Overall balance assessment: Needs assistance Sitting-balance support: Bilateral upper extremity supported;Feet supported Sitting balance-Leahy Scale: Fair Sitting balance - Comments: min guard for safety as patient swaying slightly and closing eyes                                     Pertinent Vitals/Pain Pain Assessment: Faces Faces Pain Scale: Hurts little more Pain Location: incision site Pain Descriptors / Indicators: Discomfort Pain Intervention(s): Monitored during session    Home Living Family/patient expects to be discharged to:: Shelter/Homeless                      Prior Function Level of Independence: Independent         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   Dominant Hand: Left    Extremity/Trunk Assessment   Upper Extremity Assessment Upper Extremity Assessment: Generalized weakness    Lower Extremity Assessment Lower Extremity Assessment: Generalized weakness    Cervical / Trunk Assessment Cervical / Trunk Assessment: Other exceptions Cervical / Trunk Exceptions: in siting, trunk slightly jerks and sways, eyes closed  Communication   Communication: Expressive difficulties;Other (comment)  Cognition Arousal/Alertness: Awake/alert Behavior During Therapy: Flat affect Overall Cognitive Status: Impaired/Different from baseline Area of Impairment: Attention;Following commands;Safety/judgement;Problem solving  Current Attention Level: Sustained   Following Commands: Follows one step commands with increased time Safety/Judgement: Decreased awareness of safety   Problem Solving: Slow processing;Decreased initiation;Difficulty sequencing;Requires verbal cues;Requires tactile cues General Comments: patient alert/oriented to time, place, situation. difficulty understanding due to soft spoken and  mumbled speech.      General Comments      Exercises     Assessment/Plan    PT Assessment Patient needs continued PT services  PT Problem List Decreased strength;Decreased mobility;Decreased safety awareness;Decreased knowledge of precautions;Decreased activity tolerance;Decreased cognition;Decreased balance       PT Treatment Interventions DME instruction;Therapeutic activities;Gait training;Therapeutic exercise;Patient/family education;Functional mobility training    PT Goals (Current goals can be found in the Care Plan section)  Acute Rehab PT Goals Patient Stated Goal: "find a place to live" PT Goal Formulation: With patient Time For Goal Achievement: 09/08/19 Potential to Achieve Goals: Fair    Frequency Min 2X/week   Barriers to discharge Decreased caregiver support      Co-evaluation               AM-PAC PT "6 Clicks" Mobility  Outcome Measure Help needed turning from your back to your side while in a flat bed without using bedrails?: A Lot Help needed moving from lying on your back to sitting on the side of a flat bed without using bedrails?: A Lot Help needed moving to and from a bed to a chair (including a wheelchair)?: Total Help needed standing up from a chair using your arms (e.g., wheelchair or bedside chair)?: Total Help needed to walk in hospital room?: Total Help needed climbing 3-5 steps with a railing? : Total 6 Click Score: 8    End of Session   Activity Tolerance: Patient limited by fatigue Patient left: in bed;with call bell/phone within reach;with bed alarm set Nurse Communication: Mobility status PT Visit Diagnosis: Unsteadiness on feet (R26.81);Muscle weakness (generalized) (M62.81);Difficulty in walking, not elsewhere classified (R26.2);Dizziness and giddiness (R42)    Time: 9379-0240 PT Time Calculation (min) (ACUTE ONLY): 36 min   Charges:   PT Evaluation $PT Eval Moderate Complexity: 1 Mod PT Treatments $Therapeutic  Activity: 23-37 mins         Tresa Endo PT Acute Rehabilitation Services Pager [phone] Office [phone]   Claretha Cooper 08/25/2019, 3:42 PM

## 2019-08-25 NOTE — Progress Notes (Signed)
OT Cancellation Note  Patient Details Name: Jenna [name] MRN: [id number] DOB: February 09, 1966   Cancelled Treatment:    Reason Eval/Treat Not Completed: Patient at procedure or test/ unavailable. Patient leaving floor for MRI upon arrival. Will check back as schedule allows.  Delbert Phenix OT Pager: [place] 08/25/2019, 11:44 AM

## 2019-08-25 NOTE — Progress Notes (Signed)
PROGRESS NOTE  Jenna [name] MWU:[id number] DOB: 07-27-1965 DOA: 08/21/2019 PCP: Patient, No Pcp Per  Brief History   Jenna [name] is a 54 y.o. female with no regular medical follow up on no medications who presented to the ED by EMS 7/12 when she was found lethargic on a bed of her motel lethargic with RUQ abdominal pain, N/V, no po intake for days, found to be hypotensive treated with IVF. In the ED hypotension had improved to 104/74. She remained lethargic but oriented, with slurred speech and poor historian, peritoneal signs on exam. Found to have acute renal failure with imaging confirming acute cholecystitis due to cholelithiasis in gallbladder neck. Bladder mass was also noted for which urology is consulted. Multiple metabolic derangements indicating acute renal failure with hypokalemia, elevated anion gap with bicarb 20. WBC 12.7k (PMN-predominant), mild anemia with severe microcytosis/hypochromia and thrombocytopenia at 115k. Lactic acid was normal. LFT's were also normal. CT abdomen/pelvis demonstrated findings consistent with acute cholecystitis with possible perforation, bladder mass without hydronephrosis or urolithiasis. CT head nonacute. General surgery, nephrology, and urology were consulted. IV fluids given, though patient remains lethargic. The plan is for interventional radiology to place percutaneous cholecystostomy tube on the afternoon of 08/23/2019. Urology has determined that the bladder mass is concerning for a urothelial carcinoma. They plan to schedule the patient for an outpatient TURBT once her cholecystitis has been addressed.  This morning the patient is much more alert. I also learn this morning that the patient is having difficulty with speech. She states that it is primarily difficulty in getting her words out, but it seems also to be due to difficulty with word finding. Nursing advises me that the patient's boyfriend had reported that this was going on since the night  of Friday, August 18 2019. The patient has been sent for a stat CT of the head. I have discussed this with the patient's boyfriend as directed. All questions answered as possible.  The patient has had a CT head and MRI Brain due to lethargy and dysarthria. Both were negative for stroke. I consulted Dr. Rory Percy for this patient. He feels that there is no evidence for stroke. He does state that he thinks that the patient has a metabolic encephalopathy due to her multiple acute illnesses including severe AKI.   Consultants  . Interventional Radiology . General Surgery . Nephrology . Urology . Neurology  Procedures  . Percutaneous cholecystostomy  Antibiotics   Anti-infectives (From admission, onward)   Start     Dose/Rate Route Frequency Ordered Stop   08/22/19 0200  metroNIDAZOLE (FLAGYL) IVPB 500 mg     Discontinue     500 mg 100 mL/hr over 60 Minutes Intravenous Every 8 hours 08/21/19 1810     08/21/19 2200  cefTRIAXone (ROCEPHIN) 2 g in sodium chloride 0.9 % 100 mL IVPB     Discontinue     2 g 200 mL/hr over 30 Minutes Intravenous Every 24 hours 08/21/19 1813     08/21/19 1645  ceFEPIme (MAXIPIME) 2 g in sodium chloride 0.9 % 100 mL IVPB        2 g 200 mL/hr over 30 Minutes Intravenous  Once 08/21/19 1631 08/21/19 1730   08/21/19 1645  metroNIDAZOLE (FLAGYL) IVPB 500 mg        500 mg 100 mL/hr over 60 Minutes Intravenous  Once 08/21/19 1631 08/21/19 1815      Subjective  The patient is awake and alert. She continues to complain of upper abdominal  discomfort.   Objective   Vitals:  Vitals:   08/25/19 1100 08/25/19 1200  BP: 121/75 119/72  Pulse: 65 65  Resp: (!) 21 16  Temp:  98.7 F (37.1 C)  SpO2: 94% 97%    Exam:  Constitutional:  The patient is awake and alert. No acute distress. Respiratory:  . No increased work of breathing. . No wheezes, rales, or rhonchi . No tactile fremitus Cardiovascular:  . Regular rate and rhythm . No murmurs, ectopy, or  gallups. . No lateral PMI. No thrills. Abdomen:  . Abdomen remains a little distended and slightly tender. . No hernias, masses, or organomegaly . Normoactive bowel sounds.  Musculoskeletal:  . No cyanosis, clubbing, or edema Skin:  . No rashes, lesions, ulcers . palpation of skin: no induration or nodules Neurologic:  . Pt is awake, alert, and oriented x 3. Psychiatric:  . Pt is unable to cooperate with exam.  I have personally reviewed the following:   Today's Data  . Vitals, BMP  Micro Data  . Urine culture: Polymicrobial.  Imaging  . Renal ultrasound . CT Head . MRI Brain  Scheduled Meds: . chlorhexidine  15 mL Mouth Rinse BID  . Chlorhexidine Gluconate Cloth  6 each Topical Daily  . heparin  5,000 Units Subcutaneous Q8H  . mouth rinse  15 mL Mouth Rinse BID  . mouth rinse  15 mL Mouth Rinse q12n4p  . sodium chloride flush  3 mL Intravenous Q12H  . sodium chloride flush  5 mL Intracatheter Q8H   Continuous Infusions: . cefTRIAXone (ROCEPHIN)  IV Stopped (08/24/19 2333)  . metronidazole Stopped (08/25/19 1201)    Principal Problem:   ARF (acute renal failure) (HCC) Active Problems:   Acute calculous cholecystitis   Bladder mass   Increased anion gap metabolic acidosis   Hypokalemia   LOS: 4 days   A & P  Acuterenal failure: Dehydration/prerenal suspected, though lack of significant improvement with IVF and rising BUN would suggest sepsis as primary driver. Repeat bolus and increased LR rate. Urine output is improved as charted. I appreciate nephrology's assistance. Monitor creatinine, electrolytes, and volume status. Avoid nephrotoxins and hypotension. Creatinine is much improved. It is down to 3.86 this morning. Nephrology has signed off.  Dysarthria and word finding difficulty: Nursing tells me that the patient's boyfriend had reported that this has been going on since Friday, August 18, 2019. It is just now being noticed as the patient has been lethargic  and not very verbal until this morning. CT head was negative for acute pathology as was MRI Brain. Neurology was consulted. Dr. Rory Percy feels that dysarthria and lethargy are due to encephalopathy related to acute illness. No evidence for stroke.  Acute calculous cholecystitis without biliary obstruction: LFTs wnl. Possible contained perforation vs. Phrygian cap on non-contrast CT. the patient's abdomen is distended on exam, but does not appear to be an acute abdodmen. No peritoneal signs. Interventional radiology performed transcutaneous placement of a cholecystostomy tube. The patient is receiving ceftriaxone and flagyl. The blood cultures (1) collected on 08/21/2019 has grown out enterbactereacea species. She is receiving ceftriaxone and proteus species is pan-sensitive.   Bacteremia: The blood cultures (1) collected on 08/21/2019 has grown out proteus and enterobacteriaceae species and proteus which is pan-sensitive. Cultures repeated on 08/24/2019 have had no growth. Will continue to monitor them.  Bladder mass: Seen on CT abd/pelvis 3.7cm - 3.1cm intraluminal soft tissue density. Urology has been consulted. Will need eventual TURBT. Dr. Gilford Rile plans to  schedule as outpatient after the patient has recovered from biliary illness and acute renal failure.  Anion gap metabolic acidosis: Resolving. No evidence of ingestion, lactic acid normal and no ketonuria. Due to acute kidney injury. Monitor CO2. Consider oral sodium bicarbonate.  Hyponatremia: Resolved. Sodium is 142 this morning. Monitor and continue IV fluids as above.  Hypokalemia: Resolved. K down to 3.4 this am. Supplement and monitor. Continue to monitor the patient on telemetry.  Thrombocytopenia: Resolved. Many abnormalities on smear including schistocytes though normal bilirubin argues against hemolysis. TTP felt to be less likely than sepsis as cause. Platelets are 300 this morning. Likely reactive due to sepsis.  Acute uremic  encephalopathy:Improving daily. No evidence for stroke.   I have seen and examined this patient myself. I have spent 38 minutes in her evaluation and care.  DVT prophylaxis: Heparin Code Status: Full Family Communication: None at bedside Disposition Plan:  Status is: Inpatient  Remains inpatient appropriate because:Hemodynamically unstable, Persistent severe electrolyte disturbances, Altered mental status, IV treatments appropriate due to intensity of illness or inability to take PO and Inpatient level of care appropriate due to severity of illness  Dispo: The patient is from: Home  Anticipated d/c is to: TBD. PT/OT has been ordered.  Anticipated d/c date is: > 3 days  Patient currently is not medically stable to d/c.  Jenna Reppert, DO [facility] Direct contact: see [email or web address]  7PM-7AM contact night coverage as above 08/25/2019, 3:08 PM  LOS: 2 days

## 2019-08-25 NOTE — Progress Notes (Signed)
Per patient, information is not to be given to Darnelle Catalan (sister)

## 2019-08-26 LAB — GLUCOSE, CAPILLARY
Glucose-Capillary: 133 mg/dL — ABNORMAL HIGH (ref 70–99)
Glucose-Capillary: 239 mg/dL — ABNORMAL HIGH (ref 70–99)
Glucose-Capillary: 161 mg/dL — ABNORMAL HIGH (ref 70–99)
Glucose-Capillary: 207 mg/dL — ABNORMAL HIGH (ref 70–99)

## 2019-08-26 LAB — BASIC METABOLIC PANEL
Anion gap: 11 (ref 5–15)
BUN: 46 mg/dL — ABNORMAL HIGH (ref 6–20)
CO2: 28 mmol/L (ref 22–32)
Calcium: 7.7 mg/dL — ABNORMAL LOW (ref 8.9–10.3)
Chloride: 103 mmol/L (ref 98–111)
Creatinine, Ser: 2.91 mg/dL — ABNORMAL HIGH (ref 0.44–1.00)
GFR calc Af Amer: 20 mL/min — ABNORMAL LOW (ref >60)
GFR calc non Af Amer: 18 mL/min — ABNORMAL LOW (ref >60)
Glucose, Bld: 127 mg/dL — ABNORMAL HIGH (ref 70–99)
Potassium: 3 mmol/L — ABNORMAL LOW (ref 3.5–5.1)
Sodium: 142 mmol/L (ref 135–145)

## 2019-08-26 LAB — CULTURE, BLOOD (ROUTINE X 2): Special Requests: ADEQUATE

## 2019-08-26 MED ORDER — POTASSIUM CHLORIDE 20 MEQ PO PACK
40.0000 meq | PACK | Freq: Once | ORAL | Status: AC
  Administered 2019-08-26: 40 meq via ORAL
  Filled 2019-08-26: qty 2

## 2019-08-26 MED ORDER — POTASSIUM CHLORIDE 10 MEQ/100ML IV SOLN: 10.0000 meq | INTRAVENOUS | Status: DC

## 2019-08-26 NOTE — Progress Notes (Signed)
PROGRESS NOTE  Jenna [name] YDX:[id number] DOB: [birth year] DOA: 08/21/2019 PCP: Patient, No Pcp Per  Brief History   Jenna [name] is a 54 y.o. female with no regular medical follow up on no medications who presented to the ED by EMS 7/12 when she was found lethargic on a bed of her motel lethargic with RUQ abdominal pain, N/V, no po intake for days, found to be hypotensive treated with IVF. In the ED hypotension had improved to 104/74. She remained lethargic but oriented, with slurred speech and poor historian, peritoneal signs on exam. Found to have acute renal failure with imaging confirming acute cholecystitis due to cholelithiasis in gallbladder neck. Bladder mass was also noted for which urology is consulted. Multiple metabolic derangements indicating acute renal failure with hypokalemia, elevated anion gap with bicarb 20. WBC 12.7k (PMN-predominant), mild anemia with severe microcytosis/hypochromia and thrombocytopenia at 115k. Lactic acid was normal. LFT's were also normal. CT abdomen/pelvis demonstrated findings consistent with acute cholecystitis with possible perforation, bladder mass without hydronephrosis or urolithiasis. CT head nonacute. General surgery, nephrology, and urology were consulted. IV fluids given, though patient remains lethargic. The plan is for interventional radiology to place percutaneous cholecystostomy tube on the afternoon of 08/23/2019. Urology has determined that the bladder mass is concerning for a urothelial carcinoma. They plan to schedule the patient for an outpatient TURBT once her cholecystitis has been addressed.  This morning the patient is much more alert. I also learn this morning that the patient is having difficulty with speech. She states that it is primarily difficulty in getting her words out, but it seems also to be due to difficulty with word finding. Nursing advises me that the patient's boyfriend had reported that this was going on since the night  of Friday, August 18 2019. The patient has been sent for a stat CT of the head. I have discussed this with the patient's boyfriend as directed. All questions answered as possible.  The patient has had a CT head and MRI Brain due to lethargy and dysarthria. Both were negative for stroke. I consulted Dr. Rory Percy for this patient. He feels that there is no evidence for stroke. He does state that he thinks that the patient has a metabolic encephalopathy due to her multiple acute illnesses including severe AKI.   Consultants   Interventional Radiology  General Surgery  Nephrology  Urology  Neurology  Procedures   Percutaneous cholecystostomy  Antibiotics   Anti-infectives (From admission, onward)   Start     Dose/Rate Route Frequency Ordered Stop   08/22/19 0200  metroNIDAZOLE (FLAGYL) IVPB 500 mg     Discontinue     500 mg 100 mL/hr over 60 Minutes Intravenous Every 8 hours 08/21/19 1810     08/21/19 2200  cefTRIAXone (ROCEPHIN) 2 g in sodium chloride 0.9 % 100 mL IVPB     Discontinue     2 g 200 mL/hr over 30 Minutes Intravenous Every 24 hours 08/21/19 1813     08/21/19 1645  ceFEPIme (MAXIPIME) 2 g in sodium chloride 0.9 % 100 mL IVPB        2 g 200 mL/hr over 30 Minutes Intravenous  Once 08/21/19 1631 08/21/19 1730   08/21/19 1645  metroNIDAZOLE (FLAGYL) IVPB 500 mg        500 mg 100 mL/hr over 60 Minutes Intravenous  Once 08/21/19 1631 08/21/19 1815      Subjective  The patient is awake and alert. No new complaints.  Objective  Vitals:  Vitals:   08/26/19 1151 08/26/19 1200  BP:  131/70  Pulse:  77  Resp:  (!) 22  Temp: 98.5 F (36.9 C)   SpO2:  93%    Exam:  Constitutional:  The patient is awake and alert. No acute distress. Respiratory:   No increased work of breathing.  No wheezes, rales, or rhonchi  No tactile fremitus Cardiovascular:   Regular rate and rhythm  No murmurs, ectopy, or gallups.  No lateral PMI. No thrills. Abdomen:   Abdomen  remains a little distended and slightly tender.  No hernias, masses, or organomegaly  Normoactive bowel sounds.  Musculoskeletal:   No cyanosis, clubbing, or edema Skin:   No rashes, lesions, ulcers  palpation of skin: no induration or nodules Neurologic:   Pt is awake, alert, and oriented x 3. Psychiatric:   Pt is unable to cooperate with exam.  I have personally reviewed the following:   Today's Data   Vitals, BMP  Micro Data   Urine culture: Polymicrobial.  Imaging   Renal ultrasound  CT Head  MRI Brain  Scheduled Meds:  chlorhexidine  15 mL Mouth Rinse BID   Chlorhexidine Gluconate Cloth  6 each Topical Daily   heparin  5,000 Units Subcutaneous Q8H   mouth rinse  15 mL Mouth Rinse BID   mouth rinse  15 mL Mouth Rinse q12n4p   sodium chloride flush  3 mL Intravenous Q12H   sodium chloride flush  5 mL Intracatheter Q8H   Continuous Infusions:  cefTRIAXone (ROCEPHIN)  IV Stopped (08/24/19 2333)   metronidazole 500 mg (08/26/19 1124)    Principal Problem:   ARF (acute renal failure) (HCC) Active Problems:   Acute calculous cholecystitis   Bladder mass   Increased anion gap metabolic acidosis   Hypokalemia   LOS: 5 days   A & P  Acuterenal failure: Dehydration/prerenal suspected, though lack of significant improvement with IVF and rising BUN would suggest sepsis as primary driver. Repeat bolus and increased LR rate. Urine output is improved as charted. I appreciate nephrology's assistance. Monitor creatinine, electrolytes, and volume status. Avoid nephrotoxins and hypotension. Creatinine is much improved. It is down to 3.86 this morning. Nephrology has signed off.  Dysarthria and word finding difficulty: Nursing tells me that the patient's boyfriend had reported that this has been going on since Friday, August 18, 2019. It is just now being noticed as the patient has been lethargic and not very verbal until this morning. CT head was negative for  acute pathology as was MRI Brain. Neurology was consulted. Dr. Rory Percy feels that dysarthria and lethargy are due to encephalopathy related to acute illness. No evidence for stroke.  Acute calculous cholecystitis without biliary obstruction: LFTs wnl. Possible contained perforation vs. Phrygian cap on non-contrast CT. the patient's abdomen is distended on exam, but does not appear to be an acute abdodmen. No peritoneal signs. Interventional radiology performed transcutaneous placement of a cholecystostomy tube. The patient is receiving ceftriaxone and flagyl. The blood cultures (1) collected on 08/21/2019 has grown out enterbactereacea species. She is receiving ceftriaxone and proteus species is pan-sensitive.   Bacteremia: The blood cultures (1) collected on 08/21/2019 has grown out proteus and enterobacteriaceae species and proteus which is pan-sensitive. Cultures repeated on 08/24/2019 have had no growth. Will continue to monitor them.  Bladder mass: Seen on CT abd/pelvis 3.7cm - 3.1cm intraluminal soft tissue density. Urology has been consulted. Will need eventual TURBT. Dr. Gilford Rile plans to schedule as outpatient after the  patient has recovered from biliary illness and acute renal failure.  Anion gap metabolic acidosis: Resolving. No evidence of ingestion, lactic acid normal and no ketonuria. Due to acute kidney injury. Monitor CO2. Consider oral sodium bicarbonate.  Hyponatremia: Resolved. Sodium is 142 this morning. Monitor and continue IV fluids as above.  Hypokalemia: Resolved. K down to 3.4 this am. Supplement and monitor. Continue to monitor the patient on telemetry.  Thrombocytopenia: Resolved. Many abnormalities on smear including schistocytes though normal bilirubin argues against hemolysis. TTP felt to be less likely than sepsis as cause. Platelets are 300 this morning. Likely reactive due to sepsis.  Acute uremic encephalopathy:Improving daily. No evidence for stroke.   I have  seen and examined this patient myself. I have spent 38 minutes in her evaluation and care.  DVT prophylaxis: Heparin Code Status: Full Family Communication: None at bedside Disposition Plan:  Status is: Inpatient  Remains inpatient appropriate because:Hemodynamically unstable, Persistent severe electrolyte disturbances, Altered mental status, IV treatments appropriate due to intensity of illness or inability to take PO and Inpatient level of care appropriate due to severity of illness  Dispo: The patient is from: Home  Anticipated d/c is to: TBD. PT/OT has been ordered.  Anticipated d/c date is: > 3 days  Patient currently is not medically stable to d/c.  Loan Oguin, DO [facility] Direct contact: see [email or web address]  7PM-7AM contact night coverage as above 08/26/2019, 1:58 PM  LOS: 2 days

## 2019-08-27 LAB — BASIC METABOLIC PANEL
Anion gap: 8 (ref 5–15)
BUN: 28 mg/dL — ABNORMAL HIGH (ref 6–20)
CO2: 30 mmol/L (ref 22–32)
Calcium: 7.7 mg/dL — ABNORMAL LOW (ref 8.9–10.3)
Chloride: 106 mmol/L (ref 98–111)
Creatinine, Ser: 2 mg/dL — ABNORMAL HIGH (ref 0.44–1.00)
GFR calc Af Amer: 32 mL/min — ABNORMAL LOW (ref >60)
GFR calc non Af Amer: 28 mL/min — ABNORMAL LOW (ref >60)
Glucose, Bld: 118 mg/dL — ABNORMAL HIGH (ref 70–99)
Potassium: 3.2 mmol/L — ABNORMAL LOW (ref 3.5–5.1)
Sodium: 144 mmol/L (ref 135–145)

## 2019-08-27 LAB — GLUCOSE, CAPILLARY
Glucose-Capillary: 104 mg/dL — ABNORMAL HIGH (ref 70–99)
Glucose-Capillary: 237 mg/dL — ABNORMAL HIGH (ref 70–99)
Glucose-Capillary: 164 mg/dL — ABNORMAL HIGH (ref 70–99)
Glucose-Capillary: 152 mg/dL — ABNORMAL HIGH (ref 70–99)

## 2019-08-27 MED ORDER — POTASSIUM CHLORIDE 20 MEQ PO PACK
40.0000 meq | PACK | Freq: Once | ORAL | Status: AC
  Administered 2019-08-27: 40 meq via ORAL
  Filled 2019-08-27: qty 2

## 2019-08-27 NOTE — Progress Notes (Signed)
PROGRESS NOTE  Jenna [name] HWE:[id number] DOB: 1965-09-05 DOA: 08/21/2019 PCP: Patient, No Pcp Per  Brief History   Jenna [name] is a 54 y.o. female with no regular medical follow up on no medications who presented to the ED by EMS 7/12 when she was found lethargic on a bed of her motel lethargic with RUQ abdominal pain, N/V, no po intake for days, found to be hypotensive treated with IVF. In the ED hypotension had improved to 104/74. She remained lethargic but oriented, with slurred speech and poor historian, peritoneal signs on exam. Found to have acute renal failure with imaging confirming acute cholecystitis due to cholelithiasis in gallbladder neck. Bladder mass was also noted for which urology is consulted. Multiple metabolic derangements indicating acute renal failure with hypokalemia, elevated anion gap with bicarb 20. WBC 12.7k (PMN-predominant), mild anemia with severe microcytosis/hypochromia and thrombocytopenia at 115k. Lactic acid was normal. LFT's were also normal. CT abdomen/pelvis demonstrated findings consistent with acute cholecystitis with possible perforation, bladder mass without hydronephrosis or urolithiasis. CT head nonacute. General surgery, nephrology, and urology were consulted. IV fluids given, though patient remains lethargic. The plan is for interventional radiology to place percutaneous cholecystostomy tube on the afternoon of 08/23/2019. Urology has determined that the bladder mass is concerning for a urothelial carcinoma. They plan to schedule the patient for an outpatient TURBT once her cholecystitis has been addressed.  This morning the patient is much more alert. I also learn this morning that the patient is having difficulty with speech. She states that it is primarily difficulty in getting her words out, but it seems also to be due to difficulty with word finding. Nursing advises me that the patient's boyfriend had reported that this was going on since the night  of Friday, August 18 2019. The patient has been sent for a stat CT of the head. I have discussed this with the patient's boyfriend as directed. All questions answered as possible.  The patient has had a CT head and MRI Brain due to lethargy and dysarthria. Both were negative for stroke. I consulted Dr. Rory Percy for this patient. He feels that there is no evidence for stroke. He does state that he thinks that the patient has a metabolic encephalopathy due to her multiple acute illnesses including severe AKI.   TOC has been consulted to assist with discharge planning. Patient is stating that she has no where to go.  Consultants  . Interventional Radiology . General Surgery . Nephrology . Urology . Neurology  Procedures  . Percutaneous cholecystostomy  Antibiotics   Anti-infectives (From admission, onward)   Start     Dose/Rate Route Frequency Ordered Stop   08/22/19 0200  metroNIDAZOLE (FLAGYL) IVPB 500 mg     Discontinue     500 mg 100 mL/hr over 60 Minutes Intravenous Every 8 hours 08/21/19 1810     08/21/19 2200  cefTRIAXone (ROCEPHIN) 2 g in sodium chloride 0.9 % 100 mL IVPB     Discontinue     2 g 200 mL/hr over 30 Minutes Intravenous Every 24 hours 08/21/19 1813     08/21/19 1645  ceFEPIme (MAXIPIME) 2 g in sodium chloride 0.9 % 100 mL IVPB        2 g 200 mL/hr over 30 Minutes Intravenous  Once 08/21/19 1631 08/21/19 1730   08/21/19 1645  metroNIDAZOLE (FLAGYL) IVPB 500 mg        500 mg 100 mL/hr over 60 Minutes Intravenous  Once 08/21/19 1631 08/21/19 1815  Subjective  The patient is awake and alert. No new complaints.  Objective   Vitals:  Vitals:   08/27/19 0400 08/27/19 0800  BP:    Pulse:    Resp:    Temp: 99.1 F (37.3 C) 98.6 F (37 C)  SpO2:      Exam:  Constitutional:  The patient is awake and alert. No acute distress. Respiratory:  . No increased work of breathing. . No wheezes, rales, or rhonchi . No tactile fremitus Cardiovascular:  . Regular  rate and rhythm . No murmurs, ectopy, or gallups. . No lateral PMI. No thrills. Abdomen:  . Abdomen remains a little distended and slightly tender. . No hernias, masses, or organomegaly . Normoactive bowel sounds.  Musculoskeletal:  . No cyanosis, clubbing, or edema Skin:  . No rashes, lesions, ulcers . palpation of skin: no induration or nodules Neurologic:  . Pt is awake, alert, and oriented x 3. Psychiatric:  . Pt is unable to cooperate with exam.  I have personally reviewed the following:   Today's Data  . Vitals, BMP  Micro Data  . Urine culture: Polymicrobial.  Imaging  . Renal ultrasound . CT Head . MRI Brain  Scheduled Meds: . chlorhexidine  15 mL Mouth Rinse BID  . Chlorhexidine Gluconate Cloth  6 each Topical Daily  . heparin  5,000 Units Subcutaneous Q8H  . mouth rinse  15 mL Mouth Rinse BID  . mouth rinse  15 mL Mouth Rinse q12n4p  . sodium chloride flush  3 mL Intravenous Q12H  . sodium chloride flush  5 mL Intracatheter Q8H   Continuous Infusions: . cefTRIAXone (ROCEPHIN)  IV Stopped (08/26/19 2157)  . metronidazole 500 mg (08/27/19 1013)    Principal Problem:   ARF (acute renal failure) (HCC) Active Problems:   Acute calculous cholecystitis   Bladder mass   Increased anion gap metabolic acidosis   Hypokalemia   LOS: 6 days   A & P  Acuterenal failure: Dehydration/prerenal suspected, though lack of significant improvement with IVF and rising BUN would suggest sepsis as primary driver. Repeat bolus and increased LR rate. Urine output is improved as charted. I appreciate nephrology's assistance. Monitor creatinine, electrolytes, and volume status. Avoid nephrotoxins and hypotension. Creatinine is much improved. It is down to 2.0 this morning. Nephrology has signed off.  Dysarthria and word finding difficulty: Nursing tells me that the patient's boyfriend had reported that this has been going on since Friday, August 18, 2019. It is just now being  noticed as the patient has been lethargic and not very verbal until this morning. CT head was negative for acute pathology as was MRI Brain. Neurology was consulted. Dr. Rory Percy feels that dysarthria and lethargy are due to encephalopathy related to acute illness. No evidence for stroke.  Acute calculous cholecystitis without biliary obstruction: LFTs wnl. Possible contained perforation vs. Phrygian cap on non-contrast CT. the patient's abdomen is distended on exam, but does not appear to be an acute abdodmen. No peritoneal signs. Interventional radiology performed transcutaneous placement of a cholecystostomy tube. The patient is receiving ceftriaxone and flagyl. The blood cultures (1) collected on 08/21/2019 has grown out enterbactereacea species. She is receiving ceftriaxone and proteus species is pan-sensitive.   Bacteremia: The blood cultures (1) collected on 08/21/2019 has grown out proteus and enterobacteriaceae species and proteus which is pan-sensitive. Cultures repeated on 08/24/2019 have had no growth. Will continue to monitor them.  Bladder mass: Seen on CT abd/pelvis 3.7cm - 3.1cm intraluminal soft tissue density.  Urology has been consulted. Will need eventual TURBT. Dr. Gilford Rile plans to schedule as outpatient after the patient has recovered from biliary illness and acute renal failure.  Anion gap metabolic acidosis: Resolved. No evidence of ingestion, lactic acid normal and no ketonuria. Due to acute kidney injury. Monitor CO2. Consider oral sodium bicarbonate.  Hyponatremia: Resolved. Sodium is 142 this morning. Monitor and continue IV fluids as above.  Hypokalemia: K down to 3.2 this am. Supplement and monitor. Continue to monitor the patient on telemetry.  Thrombocytopenia: Resolved. Many abnormalities on smear including schistocytes though normal bilirubin argues against hemolysis. TTP felt to be less likely than sepsis as cause. Platelets are 300 this morning. Likely reactive due to  sepsis.  Acute uremic encephalopathy:Improving daily. No evidence for stroke.   I have seen and examined this patient myself. I have spent 32 minutes in her evaluation and care.  DVT prophylaxis: Heparin Code Status: Full Family Communication: None at bedside Disposition Plan:  Status is: Inpatient  Remains inpatient appropriate because:Hemodynamically unstable, Persistent severe electrolyte disturbances, Altered mental status, IV treatments appropriate due to intensity of illness or inability to take PO and Inpatient level of care appropriate due to severity of illness  Dispo: The patient is from: Home  Anticipated d/c is to: TBD. Patient states that she has nowhere to go. PT/OT has been ordered.  Anticipated d/c date is: > 3 days  Patient is currently medically stable to d/c, but does not have a safe discharge.  Status is: Inpatient  Remains inpatient appropriate because:Unsafe d/c plan   Dispo: The patient is from: Home              Anticipated d/c is to: TBD              Anticipated d/c date is: 2 days              Patient currently is medically stable to d/c.        December Hedtke, DO [facility] Direct contact: see [email or web address]  7PM-7AM contact night coverage as above 08/26/2019, 1:58 PM  LOS: 2 days

## 2019-08-27 NOTE — Progress Notes (Signed)
Occupational Therapy Treatment Patient Details Name: Jenna [name] MRN: [id number] DOB: 10/03/1965 Today's Date: 08/27/2019    History of present illness Patient is a 54 year old female admitted 08/21/19 with lethargy, abdominal pain, passing out. Severe renal failure, with cholecystitis, bladder tumor and large gallstone found.  PMH: depression, anxiety. 08/23/19 percutaneous cholecystostomy tube placement    OT comments  Today's treatment focused on improving functional mobility and independence with ADLs. Patient able to transfer to [facility] and perform toileting with min assist. Patient ambulated to bathroom and stood at sink to wash hands and brush hair with min guard. Patient had mild complaints of right sided pain and reports of feeling "funny in the head" at times when she gets up but no complaints today. Cont POC.   Follow Up Recommendations  Supervision/Assistance - 24 hour;SNF;Other (comment)    Equipment Recommendations  Other (comment)    Recommendations for Other Services      Precautions / Restrictions Precautions Precautions: Fall Precaution Comments: R perc chole tube  Restrictions Weight Bearing Restrictions: No       Mobility Bed Mobility Overal bed mobility: Needs Assistance Bed Mobility: Supine to Sit     Supine to sit: Supervision Sit to supine: Supervision   General bed mobility comments: Patienta able to perform mobility with supervision. patient able scoot herself up in bed with verbal cue on technique.  Transfers Overall transfer level: Needs assistance Equipment used: Rolling walker (2 wheeled) Transfers: Sit to/from Stand Sit to Stand: Min guard         General transfer comment: Patient ambulated to [facility] and then to sink in bathroom with min guard and use of RW. However, RW and lines/leads appear to hinder patient's gait.    Balance Overall balance assessment: Mild deficits observed, not formally tested                                          ADL either performed or assessed with clinical judgement   ADL       Grooming: Standing;Wash/dry hands;Brushing hair (patient performed washing hands and brushing hair at sink.)                       Toileting- Clothing Manipulation and Hygiene: Min guard;Minimal assistance         General ADL Comments: Patient able to perform transfer to [facility] with min guard - and assistance needed to monitor drain. Patient performed toileting with min assist - patient performed clothing management and wiping - with therapist going back over to check quality of wiping.     Vision   Vision Assessment?: No apparent visual deficits   Perception     Praxis      Cognition Arousal/Alertness: Awake/alert Behavior During Therapy: WFL for tasks assessed/performed Overall Cognitive Status: Within Functional Limits for tasks assessed                                          Exercises     Shoulder Instructions       General Comments      Pertinent Vitals/ Pain       Faces Pain Scale: Hurts a little bit Pain Location: incision site Pain Descriptors / Indicators: Grimacing Pain Intervention(s): Monitored during session  Home  Living                                          Prior Functioning/Environment              Frequency  Min 3X/week        Progress Toward Goals  OT Goals(current goals can now be found in the care plan section)  Progress towards OT goals: Progressing toward goals  Acute Rehab OT Goals Patient Stated Goal: get stronger OT Goal Formulation: With patient Time For Goal Achievement: 09/07/19 Potential to Achieve Goals: Good  Plan Discharge plan remains appropriate    Co-evaluation                 AM-PAC OT "6 Clicks" Daily Activity     Outcome Measure   Help from another person eating meals?: None Help from another person taking care of personal grooming?: A Little Help from  another person toileting, which includes using toliet, bedpan, or urinal?: A Little Help from another person bathing (including washing, rinsing, drying)?: A Little Help from another person to put on and taking off regular upper body clothing?: A Little Help from another person to put on and taking off regular lower body clothing?: A Lot 6 Click Score: 18    End of Session Equipment Utilized During Treatment: Gait belt;Rolling walker  OT Visit Diagnosis: Unsteadiness on feet (R26.81);Other abnormalities of gait and mobility (R26.89);Muscle weakness (generalized) (M62.81);Pain Pain - Right/Left: Right Pain - part of body:  (side)   Activity Tolerance Patient tolerated treatment well   Patient Left in bed;with call bell/phone within reach;with bed alarm set   Nurse Communication  (okay to see per RN)        Time: 4193-7902 OT Time Calculation (min): 22 min  Charges: OT General Charges $OT Visit: 1 Visit OT Treatments $Self Care/Home Management : 8-22 mins  Derl Barrow, OTR/L [place]  Office [phone] Pager: [place] 08/27/2019, 5:16 PM

## 2019-08-28 LAB — GLUCOSE, CAPILLARY
Glucose-Capillary: 110 mg/dL — ABNORMAL HIGH (ref 70–99)
Glucose-Capillary: 110 mg/dL — ABNORMAL HIGH (ref 70–99)
Glucose-Capillary: 113 mg/dL — ABNORMAL HIGH (ref 70–99)
Glucose-Capillary: 115 mg/dL — ABNORMAL HIGH (ref 70–99)
Glucose-Capillary: 120 mg/dL — ABNORMAL HIGH (ref 70–99)
Glucose-Capillary: 190 mg/dL — ABNORMAL HIGH (ref 70–99)

## 2019-08-28 LAB — AEROBIC/ANAEROBIC CULTURE W GRAM STAIN (SURGICAL/DEEP WOUND): Special Requests: NORMAL

## 2019-08-28 LAB — BASIC METABOLIC PANEL
Anion gap: 11 (ref 5–15)
BUN: 15 mg/dL (ref 6–20)
CO2: 27 mmol/L (ref 22–32)
Calcium: 7.5 mg/dL — ABNORMAL LOW (ref 8.9–10.3)
Chloride: 103 mmol/L (ref 98–111)
Creatinine, Ser: 1.73 mg/dL — ABNORMAL HIGH (ref 0.44–1.00)
GFR calc Af Amer: 38 mL/min — ABNORMAL LOW (ref >60)
GFR calc non Af Amer: 33 mL/min — ABNORMAL LOW (ref >60)
Glucose, Bld: 256 mg/dL — ABNORMAL HIGH (ref 70–99)
Potassium: 3 mmol/L — ABNORMAL LOW (ref 3.5–5.1)
Sodium: 141 mmol/L (ref 135–145)

## 2019-08-28 MED ORDER — CEPHALEXIN 500 MG PO CAPS
500.0000 mg | ORAL_CAPSULE | Freq: Four times a day (QID) | ORAL | Status: DC
  Administered 2019-08-28 – 2019-08-29 (×3): 500 mg via ORAL
  Filled 2019-08-28 (×3): qty 1

## 2019-08-28 NOTE — Progress Notes (Signed)
PROGRESS NOTE  Jenna [name] YHC:[id number] DOB: 06/21/1965 DOA: 08/21/2019 PCP: Patient, No Pcp Per  Brief History   Jenna [name] is a 54 y.o. female with no regular medical follow up on no medications who presented to the ED by EMS 7/12 when she was found lethargic on a bed of her motel lethargic with RUQ abdominal pain, N/V, no po intake for days, found to be hypotensive treated with IVF. In the ED hypotension had improved to 104/74. She remained lethargic but oriented, with slurred speech and poor historian, peritoneal signs on exam. Found to have acute renal failure with imaging confirming acute cholecystitis due to cholelithiasis in gallbladder neck. Bladder mass was also noted for which urology is consulted. Multiple metabolic derangements indicating acute renal failure with hypokalemia, elevated anion gap with bicarb 20. WBC 12.7k (PMN-predominant), mild anemia with severe microcytosis/hypochromia and thrombocytopenia at 115k. Lactic acid was normal. LFT's were also normal. CT abdomen/pelvis demonstrated findings consistent with acute cholecystitis with possible perforation, bladder mass without hydronephrosis or urolithiasis. CT head nonacute. General surgery, nephrology, and urology were consulted. IV fluids given, though patient remains lethargic. The plan is for interventional radiology to place percutaneous cholecystostomy tube on the afternoon of 08/23/2019. Urology has determined that the bladder mass is concerning for a urothelial carcinoma. They plan to schedule the patient for an outpatient TURBT once her cholecystitis has been addressed.  This morning the patient is much more alert. I also learn this morning that the patient is having difficulty with speech. She states that it is primarily difficulty in getting her words out, but it seems also to be due to difficulty with word finding. Nursing advises me that the patient's boyfriend had reported that this was going on since the night  of Friday, August 18 2019. The patient has been sent for a stat CT of the head. I have discussed this with the patient's boyfriend as directed. All questions answered as possible.  The patient has had a CT head and MRI Brain due to lethargy and dysarthria. Both were negative for stroke. I consulted Dr. Rory Percy for this patient. He feels that there is no evidence for stroke. He does state that he thinks that the patient has a metabolic encephalopathy due to her multiple acute illnesses including severe AKI.   TOC has been consulted to assist with discharge planning. Patient is stating that she has no where to go.  Consultants  . Interventional Radiology . General Surgery . Nephrology . Urology . Neurology  Procedures  . Percutaneous cholecystostomy  Antibiotics   Anti-infectives (From admission, onward)   Start     Dose/Rate Route Frequency Ordered Stop   08/28/19 2200  cephALEXin (KEFLEX) capsule 500 mg     Discontinue     500 mg Oral 4 times daily 08/28/19 1155     08/22/19 0200  metroNIDAZOLE (FLAGYL) IVPB 500 mg  Status:  Discontinued        500 mg 100 mL/hr over 60 Minutes Intravenous Every 8 hours 08/21/19 1810 08/28/19 1155   08/21/19 2200  cefTRIAXone (ROCEPHIN) 2 g in sodium chloride 0.9 % 100 mL IVPB  Status:  Discontinued        2 g 200 mL/hr over 30 Minutes Intravenous Every 24 hours 08/21/19 1813 08/28/19 1155   08/21/19 1645  ceFEPIme (MAXIPIME) 2 g in sodium chloride 0.9 % 100 mL IVPB        2 g 200 mL/hr over 30 Minutes Intravenous  Once 08/21/19 1631  08/21/19 1730   08/21/19 1645  metroNIDAZOLE (FLAGYL) IVPB 500 mg        500 mg 100 mL/hr over 60 Minutes Intravenous  Once 08/21/19 1631 08/21/19 1815      Subjective  The patient is awake and alert. No new complaints.  Objective   Vitals:  Vitals:   08/28/19 0759 08/28/19 1326  BP: 127/83 123/75  Pulse: 70 73  Resp: 16 16  Temp: 98.6 F (37 C) 97.9 F (36.6 C)  SpO2: 94% 96%    Exam:  Constitutional:    The patient is awake and alert. No acute distress. Respiratory:  . No increased work of breathing. . No wheezes, rales, or rhonchi . No tactile fremitus Cardiovascular:  . Regular rate and rhythm . No murmurs, ectopy, or gallups. . No lateral PMI. No thrills. Abdomen:  . Abdomen remains a little distended and slightly tender. . No hernias, masses, or organomegaly . Normoactive bowel sounds.  Musculoskeletal:  . No cyanosis, clubbing, or edema Skin:  . No rashes, lesions, ulcers . palpation of skin: no induration or nodules Neurologic:  . Pt is awake, alert, and oriented x 3. Psychiatric:  . Pt is unable to cooperate with exam.  I have personally reviewed the following:   Today's Data  . Vitals, BMP  Micro Data  . Urine culture: Polymicrobial.  Imaging  . Renal ultrasound . CT Head . MRI Brain  Scheduled Meds: . cephALEXin  500 mg Oral QID  . chlorhexidine  15 mL Mouth Rinse BID  . Chlorhexidine Gluconate Cloth  6 each Topical Daily  . heparin  5,000 Units Subcutaneous Q8H  . mouth rinse  15 mL Mouth Rinse q12n4p  . sodium chloride flush  5 mL Intracatheter Q8H   Principal Problem:   ARF (acute renal failure) (HCC) Active Problems:   Acute calculous cholecystitis   Bladder mass   Increased anion gap metabolic acidosis   Hypokalemia   LOS: 7 days   A & P  Acuterenal failure: Dehydration/prerenal suspected, though lack of significant improvement with IVF and rising BUN would suggest sepsis as primary driver. Repeat bolus and increased LR rate. Urine output is improved as charted. I appreciate nephrology's assistance. Monitor creatinine, electrolytes, and volume status. Avoid nephrotoxins and hypotension. Creatinine is much improved. It is down to 2.0 this morning. Nephrology has signed off.  Dysarthria and word finding difficulty: Nursing tells me that the patient's boyfriend had reported that this has been going on since Friday, August 18, 2019. It is just now  being noticed as the patient has been lethargic and not very verbal until this morning. CT head was negative for acute pathology as was MRI Brain. Neurology was consulted. Dr. Rory Percy feels that dysarthria and lethargy are due to encephalopathy related to acute illness. No evidence for stroke.  Acute calculous cholecystitis without biliary obstruction: LFTs wnl. Possible contained perforation vs. Phrygian cap on non-contrast CT. the patient's abdomen is distended on exam, but does not appear to be an acute abdodmen. No peritoneal signs. Interventional radiology performed transcutaneous placement of a cholecystostomy tube. The patient is receiving ceftriaxone and flagyl. The blood cultures (1) collected on 08/21/2019 has grown out enterbactereacea species. She is receiving ceftriaxone and proteus species is pan-sensitive. General surgery has signed off.  Bacteremia: The blood cultures (1) collected on 08/21/2019 has grown out proteus and enterobacteriaceae species and proteus which is pan-sensitive. Cultures repeated on 08/24/2019 have had no growth. Will continue to monitor them. She has been  transitioned to Keflex.  Bladder mass: Seen on CT abd/pelvis 3.7cm - 3.1cm intraluminal soft tissue density. Urology has been consulted. Will need eventual TURBT. Dr. Gilford Rile plans to schedule as outpatient after the patient has recovered from biliary illness and acute renal failure.  Anion gap metabolic acidosis: Resolved. No evidence of ingestion, lactic acid normal and no ketonuria. Due to acute kidney injury. Monitor CO2. Consider oral sodium bicarbonate.  Hyponatremia: Resolved. Sodium is 142 this morning. Monitor and continue IV fluids as above.  Hypokalemia: K down to 3.0 this am. Supplement and monitor. Continue to monitor the patient on telemetry.  Thrombocytopenia: Resolved. Many abnormalities on smear including schistocytes though normal bilirubin argues against hemolysis. TTP felt to be less likely  than sepsis as cause. Platelets are 300 this morning. Likely reactive due to sepsis.  Acute uremic encephalopathy:Improving daily. No evidence for stroke.   I have seen and examined this patient myself. I have spent 30 minutes in her evaluation and care.  DVT prophylaxis: Heparin Code Status: Full Family Communication: None at bedside Disposition Plan:  Status is: Inpatient  Remains inpatient appropriate because:Hemodynamically unstable, Persistent severe electrolyte disturbances, Altered mental status, IV treatments appropriate due to intensity of illness or inability to take PO and Inpatient level of care appropriate due to severity of illness  Dispo: The patient is from: Home  Anticipated d/c is to: TBD. Patient states that she has nowhere to go. PT/OT has been ordered.  Anticipated d/c date is: > 3 days  Patient is currently medically stable to d/c, but does not have a safe discharge.  Status is: Inpatient  Remains inpatient appropriate because:Unsafe d/c plan   Dispo: The patient is from: Home              Anticipated d/c is to: TBD              Anticipated d/c date is: 2 days              Patient currently is medically stable to d/c.  Jenna Patchen, DO [facility] Direct contact: see [email or web address]  7PM-7AM contact night coverage as above 08/28/2019, 6:47 PM  LOS: 2 days

## 2019-08-28 NOTE — TOC Initial Note (Signed)
Transition of Care [facility]) - Initial/Assessment Note    Patient Details  Name: Jenna [name] MRN: [id number] Date of Birth: 1965/04/26  [facility]) CM/SW Contact:    Bartholome Bill, RN Phone Number: 08/28/2019, 1:36 PM  Clinical Narrative:                 This CM met with pt at bedside for dc planning. Per pt, up until last Saturday July 10th she was working at [employer] and [employer] and receiving a paycheck. Pt states she plans to go back to work there when she is able. Pt states she has been living in a motel and her boyfriend is calling today to get her a room back at the same motel. Pt states that her boyfriend brings her food and she also has some food stamps. Pt states she rides the bus and is able to get all around town. When PT saw her today she walked 171ft min assist with her IV pole. Pt denies that she needs a RW at this time. Pt also declines to go to a shelter at discharge. Pt states that her and her boyfriend are trying to get a place together. TOC may be able to help her with medication assistance at dc and could possibly help with bus passes. Pt informed to alert nursing staff if she decides she would like to go to a shelter.  Expected Discharge Plan: Home/Self Care Barriers to Discharge: Continued Medical Work up Expected Discharge Plan and Services Expected Discharge Plan: Home/Self Care   Discharge Planning Services: CM Consult   Living arrangements for the past 2 months: Hotel/Motel                   Prior Living Arrangements/Services Living arrangements for the past 2 months: Hotel/Motel Lives with:: Self   Do you feel safe going back to the place where you live?: Yes               Activities of Daily Living Home Assistive Devices/Equipment: Eyeglasses ADL Screening (condition at time of admission) Patient's cognitive ability adequate to safely complete daily activities?: No (patient very drowsy) Is the patient deaf or have  difficulty hearing?: No Does the patient have difficulty seeing, even when wearing glasses/contacts?: No Does the patient have difficulty concentrating, remembering, or making decisions?: Yes Patient able to express need for assistance with ADLs?: No Does the patient have difficulty dressing or bathing?: Yes Independently performs ADLs?: No Communication: Independent Dressing (OT): Dependent Is this a change from baseline?: Change from baseline, expected to last >3 days Grooming: Dependent Is this a change from baseline?: Change from baseline, expected to last >3 days Feeding: Dependent Is this a change from baseline?: Change from baseline, expected to last >3 days Bathing: Dependent Is this a change from baseline?: Change from baseline, expected to last >3 days Toileting: Dependent Is this a change from baseline?: Change from baseline, expected to last >3days In/Out Bed: Dependent Is this a change from baseline?: Change from baseline, expected to last >3 days Walks in Home: Dependent Is this a change from baseline?: Change from baseline, expected to last >3 days Does the patient have difficulty walking or climbing stairs?: Yes Weakness of Legs: Both Weakness of Arms/Hands: Both  Permission Sought/Granted                  Emotional Assessment Appearance:: Appears older than stated age Attitude/Demeanor/Rapport: Guarded Affect (typically observed): Calm Orientation: : Oriented  to Self, Oriented to Place, Oriented to  Time, Oriented to Situation      Admission diagnosis:  ARF (acute renal failure) ([place]) [N17.9] AKI (acute kidney injury) ([place]) [N17.9] Patient Active Problem List   Diagnosis Date Noted   ARF (acute renal failure) ([place]) 08/21/2019   Acute calculous cholecystitis 08/21/2019   Bladder mass 08/21/2019   Increased anion gap metabolic acidosis 65/78/4696   Hypokalemia 08/21/2019   PCP:  Patient, No Pcp Per Pharmacy:   [id number] [place]  [address] [id number]: [phone]: [phone]    Social Determinants of Health (SDOH) Interventions    Readmission Risk Interventions No flowsheet data found.

## 2019-08-28 NOTE — Progress Notes (Signed)
Physical Therapy Treatment Patient Details Name: Jenna [name] MRN: [id number] DOB: Oct 29, 1965 Today's Date: 08/28/2019    History of Present Illness Patient is a 54 year old female admitted 08/21/19 with lethargy, abdominal pain, passing out. Severe renal failure, with cholecystitis, bladder tumor and large gallstone found.  PMH: depression, anxiety. 08/23/19 percutaneous cholecystostomy tube placement     PT Comments    Progressing with mobility.    Follow Up Recommendations  SNF vs Home health PT (depending on continued progress)     Equipment Recommendations  Rolling walker with 5" wheels (possibly-if pt is agreeable)    Recommendations for Other Services       Precautions / Restrictions Precautions Precautions: Fall Precaution Comments: R perc chole tube  Restrictions Weight Bearing Restrictions: No    Mobility  Bed Mobility Overal bed mobility: Modified Independent                Transfers Overall transfer level: Needs assistance   Transfers: Sit to/from Stand Sit to Stand: Min guard            Ambulation/Gait Ambulation/Gait assistance: Min guard Gait Distance (Feet): 100 Feet Assistive device: IV Pole Gait Pattern/deviations: Step-through pattern;Decreased stride length     General Gait Details: Close guarding provided. Dyspnea 2/4. Unsteady.   Stairs             Wheelchair Mobility    Modified Rankin (Stroke Patients Only)       Balance Overall balance assessment: Needs assistance           Standing balance-Leahy Scale: Fair                              Cognition Arousal/Alertness: Awake/alert Behavior During Therapy: WFL for tasks assessed/performed Overall Cognitive Status: Within Functional Limits for tasks assessed                                        Exercises      General Comments        Pertinent Vitals/Pain Pain Assessment: Faces Faces Pain Scale: Hurts a little  bit Pain Location: abdomen Pain Descriptors / Indicators: Sore;Discomfort Pain Intervention(s): Monitored during session    Home Living                      Prior Function            PT Goals (current goals can now be found in the care plan section) Progress towards PT goals: Progressing toward goals    Frequency    Min 3X/week      PT Plan Current plan remains appropriate;Frequency needs to be updated    Co-evaluation              AM-PAC PT "6 Clicks" Mobility   Outcome Measure  Help needed turning from your back to your side while in a flat bed without using bedrails?: A Little Help needed moving from lying on your back to sitting on the side of a flat bed without using bedrails?: A Little Help needed moving to and from a bed to a chair (including a wheelchair)?: A Little Help needed standing up from a chair using your arms (e.g., wheelchair or bedside chair)?: A Little Help needed to walk in hospital room?: A Little Help needed climbing 3-5 steps with a  railing? : A Lot 6 Click Score: 17    End of Session   Activity Tolerance: Patient tolerated treatment well Patient left: in bed;with call bell/phone within reach;with bed alarm set   PT Visit Diagnosis: Unsteadiness on feet (R26.81);Muscle weakness (generalized) (M62.81);Difficulty in walking, not elsewhere classified (R26.2)     Time: 8315-1761 PT Time Calculation (min) (ACUTE ONLY): 12 min  Charges:  $Gait Training: 8-22 mins                         Doreatha Massed, PT Acute Rehabilitation  Office: [phone] Pager: [phone]

## 2019-08-28 NOTE — Discharge Instructions (Signed)
Cholecystostomy, Care After This sheet gives you information about how to care for yourself after your procedure. Your health care provider may also give you more specific instructions. If you have problems or questions, contact your health care provider. What can I expect after the procedure? After your procedure, it is common to have soreness near the incision site of your drainage tube (catheter). Follow these instructions at home: Incision care   Follow instructions from your health care provider about how to take care of your incision site where the catheter was inserted. Make sure you: ? Wash your hands with soap and water before and after you change your bandage (dressing). If soap and water are not available, use hand sanitizer. ? Change your dressing as told by your health care provider.  Check the incision site every day for signs of infection. Check for: ? Redness, swelling, or pain. ? Fluid or blood. ? Warmth. ? Pus or a bad smell.  Do not take baths, swim, or use a hot tub until your health care provider approves. Ask your health care provider if you may take showers. You may only be allowed to take sponge baths. General instructions  Follow instructions from your health care provider about how to care for your catheter and collection bag at home.  Your health care provider will show you: ? How to record the amount of drainage from the catheter. ? How to flush the catheter. ? How to care for the catheter incision site.  Follow instructions from your health care provider about eating or drinking restrictions.  Take over-the-counter and prescription medicines only as told by your health care provider.  Keep all follow-up visits as told by your health care provider. This is important. Contact a health care provider if:  You have redness, swelling, or pain around the catheter incision site.  You have nausea or vomiting. Get help right away if:  Your abdominal pain  gets worse.  You feel dizzy or you faint while standing.  You have fluid or blood coming from the catheter incision site.  The area around the catheter incision site feels warm to the touch.  You have pus or a bad smell coming from the catheter incision site.  You have a fever.  You have shortness of breath.  You have a rapid heartbeat.  Your nausea or vomiting does not go away.  Your catheter becomes blocked.  Your catheter comes out of your abdomen. Summary  After your procedure, it is common to have soreness near the incision site of your drainage tube (catheter).  Wash your hands with soap and water before and after you change your bandage (dressing). Change your dressing as told by your health care provider.  Check the catheter incision site every day for signs of infection. Check for redness, swelling, pain, fluid, blood, warmth, pus, or a bad smell.  Contact your health care provider if you have nausea or vomiting, or if you have redness, swelling, or pain around your catheter incision site.  Get help right away if your abdominal pain gets worse, you feel dizzy, you have blood or fluid coming from the catheter incision site, you have a fever, or you have shortness of breath. This information is not intended to replace advice given to you by your health care provider. Make sure you discuss any questions you have with your health care provider. Document Revised: 08/23/2017 Document Reviewed: 08/23/2017 Elsevier Patient Education  2020 [employer]vier Inc[employer]  

## 2019-08-28 NOTE — Progress Notes (Addendum)
Progress Note: General Surgery Service   Chief Complaint/Subjective: Pain controlled. Reports tolerating almost 100% of her meals. Mobilizing to the bathroom. States she does not have a safe place to D/C to.   Objective: Vital signs in last 24 hours: Temp:  [98.3 F (36.8 C)-99.4 F (37.4 C)] 98.7 F (37.1 C) (07/19 0419) Pulse Rate:  [65-80] 80 (07/19 0419) Resp:  [17-21] 20 (07/19 0419) BP: (124-167)/(68-93) 127/71 (07/19 0419) SpO2:  [92 %-98 %] 92 % (07/19 0419) Last BM Date: 08/27/19  Intake/Output from previous day: 07/18 0701 - 07/19 0700 In: 1296.2 [P.O.:1200; IV Piggyback:91.2] Out: 200 [Urine:200] Intake/Output this shift: Total I/O In: 5 [Other:5] Out: 20 [Drains:20]  Gen: NAD, cooperative, poor dentition  Resp: nonlabored ORA Card: RRR Abd: soft, drain with minimal purulent output, mild tenderness on RUQ palpation  Lab Results: CBC  No results for input(s): WBC, HGB, HCT, PLT in the last 72 hours. BMET Recent Labs    08/26/19 0242 08/27/19 0321  NA 142 144  K 3.0* 3.2*  CL 103 106  CO2 28 30  GLUCOSE 127* 118*  BUN 46* 28*  CREATININE 2.91* 2.00*  CALCIUM 7.7* 7.7*   PT/INR No results for input(s): LABPROT, INR in the last 72 hours. ABG No results for input(s): PHART, HCO3 in the last 72 hours.  Invalid input(s): PCO2, PO2  Anti-infectives: Anti-infectives (From admission, onward)   Start     Dose/Rate Route Frequency Ordered Stop   08/22/19 0200  metroNIDAZOLE (FLAGYL) IVPB 500 mg     Discontinue     500 mg 100 mL/hr over 60 Minutes Intravenous Every 8 hours 08/21/19 1810     08/21/19 2200  cefTRIAXone (ROCEPHIN) 2 g in sodium chloride 0.9 % 100 mL IVPB     Discontinue     2 g 200 mL/hr over 30 Minutes Intravenous Every 24 hours 08/21/19 1813     08/21/19 1645  ceFEPIme (MAXIPIME) 2 g in sodium chloride 0.9 % 100 mL IVPB        2 g 200 mL/hr over 30 Minutes Intravenous  Once 08/21/19 1631 08/21/19 1730   08/21/19 1645  metroNIDAZOLE  (FLAGYL) IVPB 500 mg        500 mg 100 mL/hr over 60 Minutes Intravenous  Once 08/21/19 1631 08/21/19 1815      Medications: Scheduled Meds: . chlorhexidine  15 mL Mouth Rinse BID  . Chlorhexidine Gluconate Cloth  6 each Topical Daily  . heparin  5,000 Units Subcutaneous Q8H  . mouth rinse  15 mL Mouth Rinse BID  . mouth rinse  15 mL Mouth Rinse q12n4p  . sodium chloride flush  3 mL Intravenous Q12H  . sodium chloride flush  5 mL Intracatheter Q8H   Continuous Infusions: . cefTRIAXone (ROCEPHIN)  IV 2 g (08/27/19 2211)  . metronidazole 500 mg (08/28/19 0400)   PRN Meds:.acetaminophen **OR** acetaminophen, nicotine, ondansetron **OR** ondansetron (ZOFRAN) IV  Assessment/Plan: Bladder mass - discovered incidentally on CT, urology recommending TURBT after patient recovers from below. ARF with improving Cr - BUN 28, Cr. 2.0 yesterday Acute uremic encephalopathy - improving  Proteus mirabilis bacteremia - pansensitive, repeat Cx 7/15 NGTD Hypokalemia - 3.2 yesterday, repleted, AM labs pending  Tobacco use - nicotine patch   Acute calculous cholecystitis  s/p perc chole drain 08/23/19   - continue antibiotics (for a total of 7 days)  - continue diet per SLP recommendations   - continue drain care  FEN: DYS 3  ID: Rocephin/Flagyl 7/13 >> (  day#6) VTE: SCD's, subcutaneous heparin   Follow up: Dr. Kieth Brightly 4-6 weeks after discharge   Dispo: stable for discharge from surgical perspective, primary team working on a safe discharge plan for patient    LOS: 7 days   Jill Alexanders, PA-C [facility]  Agree with above. She is having no pain and doing well now. She lived in [place] before coming to the hospital - so she had poor housing prior to admission.  She is not married and she has no children.  She is to see Dr. Kieth Brightly in 4 to 6 weeks. Will sign off.  Alphonsa Overall, MD, [facility] Office phone:   [phone]

## 2019-08-29 LAB — CULTURE, BLOOD (ROUTINE X 2)
Culture: NO GROWTH
Culture: NO GROWTH
Special Requests: ADEQUATE
Special Requests: ADEQUATE

## 2019-08-29 LAB — BASIC METABOLIC PANEL
Anion gap: 10 (ref 5–15)
BUN: 13 mg/dL (ref 6–20)
CO2: 29 mmol/L (ref 22–32)
Calcium: 7.6 mg/dL — ABNORMAL LOW (ref 8.9–10.3)
Chloride: 104 mmol/L (ref 98–111)
Creatinine, Ser: 1.3 mg/dL — ABNORMAL HIGH (ref 0.44–1.00)
GFR calc Af Amer: 54 mL/min — ABNORMAL LOW (ref >60)
GFR calc non Af Amer: 47 mL/min — ABNORMAL LOW (ref >60)
Glucose, Bld: 106 mg/dL — ABNORMAL HIGH (ref 70–99)
Potassium: 2.9 mmol/L — ABNORMAL LOW (ref 3.5–5.1)
Sodium: 143 mmol/L (ref 135–145)

## 2019-08-29 LAB — GLUCOSE, CAPILLARY
Glucose-Capillary: 118 mg/dL — ABNORMAL HIGH (ref 70–99)
Glucose-Capillary: 118 mg/dL — ABNORMAL HIGH (ref 70–99)

## 2019-08-29 LAB — MAGNESIUM: Magnesium: 1.3 mg/dL — ABNORMAL LOW (ref 1.7–2.4)

## 2019-08-29 MED ORDER — CEPHALEXIN 500 MG PO CAPS: 500.0000 mg | ORAL_CAPSULE | Freq: Four times a day (QID) | ORAL | 0 refills | Status: DC

## 2019-08-29 MED ORDER — POTASSIUM CHLORIDE CRYS ER 20 MEQ PO TBCR
40.0000 meq | EXTENDED_RELEASE_TABLET | Freq: Two times a day (BID) | ORAL | Status: DC
  Administered 2019-08-29: 40 meq via ORAL
  Filled 2019-08-29: qty 2

## 2019-08-29 MED ORDER — NICOTINE 14 MG/24HR TD PT24: 14.0000 mg | MEDICATED_PATCH | Freq: Every day | TRANSDERMAL | 0 refills | Status: DC | PRN

## 2019-08-29 MED ORDER — SODIUM CHLORIDE 0.9% FLUSH: 5.0000 mL | Freq: Three times a day (TID) | INTRAVENOUS | 0 refills | Status: AC

## 2019-08-29 NOTE — TOC Transition Note (Signed)
Transition of Care [facility]) - CM/SW Discharge Note   Patient Details  Name: Jenna [name] MRN: [id number] Date of Birth: September 08, 1965  Transition of Care [facility]) CM/SW Contact:  Lynnell Catalan, RN Phone Number: 08/29/2019, 12:50 PM   Clinical Narrative:     This CM provided pt with bus passes and [employer] coupon for Keflex at dc. RN to do teaching and provide supplies for pt perc drain.

## 2019-08-29 NOTE — Progress Notes (Signed)
Occupational Therapy Treatment and Discharge Note Patient Details Name: Jenna [name] MRN: [id number] DOB: [birth year] Today's Date: 08/29/2019    History of present illness Patient is a 54 year old female admitted 08/21/19 with lethargy, abdominal pain, passing out. Severe renal failure, with cholecystitis, bladder tumor and large gallstone found.  PMH: depression, anxiety. 08/23/19 percutaneous cholecystostomy tube placement    OT comments  Patient reports she has been ambulating to the bathroom by herself. Patient brushed her teeth, washed UB/LB in standing and ambulated to bench by window to don clothes without assistance, patient reports feeling back to baseline with mobility. MD arrive during session to notify patient of discharge today. Patient appears to have short term memory difficulties as patient did not ask about f/u regarding R perc chole tube, OT note after MD left had question written on board as visual reminder. Spoke with RN regarding patient still having questions with follow up care. No further acute OT needs at this time, will sign off.    Follow Up Recommendations  No OT follow up    Equipment Recommendations  None recommended by OT       Precautions / Restrictions Precautions Precautions: Fall Precaution Comments: R perc chole tube        Mobility Bed Mobility Overal bed mobility: Modified Independent                Transfers Overall transfer level: Modified independent Equipment used: None             General transfer comment: patient ambulated to sink then to bench by window without assistance, patient reports she has been going to bathroom by herself    Balance Overall balance assessment: Mild deficits observed, not formally tested                                         ADL either performed or assessed with clinical judgement   ADL Overall ADL's : Modified independent     Grooming: Oral care;Wash/dry face;Wash/dry  hands;Modified independent;Standing   Upper Body Bathing: Modified independent;Standing   Lower Body Bathing: Modified independent;Sit to/from stand Lower Body Bathing Details (indicate cue type and reason): wash peri area standing at sink          Toilet Transfer: Modified Independent;Ambulation           Functional mobility during ADLs: Modified independent General ADL Comments: after sink side bathing patient sat on bench to perform dressing as MD informed patient she is D/C today. patient states her friend is trying to get her previous room back at [facility] she was staying in prior to hospitalization.                Cognition Arousal/Alertness: Awake/alert Behavior During Therapy: WFL for tasks assessed/performed Overall Cognitive Status: No family/caregiver present to determine baseline cognitive functioning                                 General Comments: patient appears to have some difficulty with short term memory, did not remember to ask MD about follow up care for drain and is supposed to D/C today                   Pertinent Vitals/ Pain       Pain Assessment: Faces Faces Pain Scale: Hurts  a little bit Pain Location: abdomen Pain Descriptors / Indicators: Discomfort Pain Intervention(s): Monitored during session      Progress Toward Goals  OT Goals(current goals can now be found in the care plan section)  Progress towards OT goals: Goals met/education completed, patient discharged from OT  Acute Rehab OT Goals Patient Stated Goal: go home today OT Goal Formulation: With patient ADL Goals Pt Will Perform Grooming: with set-up;sitting Pt Will Perform Upper Body Dressing: with set-up;sitting Pt Will Perform Lower Body Dressing: with min assist;sit to/from stand;sitting/lateral leans;with adaptive equipment Pt Will Transfer to Toilet: with min assist;bedside commode;stand pivot transfer Pt Will Perform Toileting - Clothing Manipulation  and hygiene: with min assist;sit to/from stand;sitting/lateral leans  Plan All goals met and education completed, patient discharged from OT services       AM-PAC OT "6 Clicks" Daily Activity     Outcome Measure   Help from another person eating meals?: None Help from another person taking care of personal grooming?: None Help from another person toileting, which includes using toliet, bedpan, or urinal?: None Help from another person bathing (including washing, rinsing, drying)?: None Help from another person to put on and taking off regular upper body clothing?: None Help from another person to put on and taking off regular lower body clothing?: None 6 Click Score: 24    End of Session  OT Visit Diagnosis: Pain Pain - Right/Left: Right Pain - part of body:  (abdomen)   Activity Tolerance Patient tolerated treatment well   Patient Left Other (comment) (seated at bench by window, RN made aware)   Nurse Communication Mobility status        Time: 1130-1147 OT Time Calculation (min): 17 min  Charges: OT General Charges $OT Visit: 1 Visit OT Treatments $Self Care/Home Management : 8-22 mins  Delbert Phenix OT Pager: [phone]   Rosemary Holms 08/29/2019, 12:07 PM

## 2019-08-29 NOTE — Progress Notes (Addendum)
K+ 2.9 this am, Jenna swayze DO made aware before discharge  Pt sent w supplies for drain care. Educated on flushing, care, and dressing changes using teachback method  Discussed meds and AVS, no further questions at this time from pt

## 2019-08-29 NOTE — Discharge Summary (Signed)
Physician Discharge Summary  Jenna [name] MCN:[id number] DOB: 1965-10-19 DOA: 08/21/2019  PCP: Patient, No Pcp Per  Admit date: 08/21/2019 Discharge date: 08/29/2019  Recommendations for Outpatient Follow-up:  1. Discharge to home. 2. Follow up with PCP in 7-10 days. 3. No driving until cleared by PCP 4. Follow up with interventional radiology as directed for drain clinic and cholangiogram. 5. Drain is to be irrigated three times daily with 5 cc normal saline.   Follow-up Information    Ceasar Mons, MD. Schedule an appointment as soon as possible for a visit in 2 weeks.   Specialty: Urology Why: Bladder mass Contact information: [address] [place] [address] [phone]        Kinsinger, Arta Bruce, MD Follow up on 10/06/2019.   Specialty: General Surgery Why: 11:00am, arrive by 10:30am for paperwork and check in process Contact information: [address] [place] [address] [phone]        Jacqulynn Cadet, MD Follow up in 6 week(s).   Specialties: Interventional Radiology, Radiology Why: will need drain evaluation Contact information: [address] [place] [address] [place] [address] [phone]              Discharge Diagnoses: Principal diagnosis is #1 1. Acute renal failrue 2. Metabolic encephalopathy with dysarthria, decreased level of consciousness, and word-finding difficulty due to acute uremia 3. Acute calculous cholecystitis without biliary obstruction. S/P percutaneous drainage 4. Bacteremia 5. Bladder Mass 6. Anion gap metabolic acidosis 7. Hyponatremia 8. Hypokalemia 9. Thrombocytopenia  Discharge Condition: Fair  Disposition: Home  Diet recommendation: Heart healthy  Filed Weights   08/25/19 0500 08/26/19 0500 08/27/19 0500  Weight: 70.6 kg 71.5 kg 70.3 kg    History of present illness:   Jenna [name] is a 54 y.o. female with a history of depression not on medications who  presented to the ED by EMS when she was found lethargic on a bed of her motel. She was hypotensive and complained mainly of abdominal pain progressively worsening since 3 afternoons before associated with nausea and vomiting any attempt at oral intake. Also having loose stools. Pain is mostly in the RUQ, constant, sharp, severe. Also reports decreased fever and decreased urine output but no hematuria. No previous fevers, chills, night sweats, unintentional weight loss.   ED Course: 104/74, 57bpm, 97.31F, lethargic but oriented, slurred speech and poor historian, peritoneal signs on exam. Multiple metabolic derangements indicating acute renal failure with hypokalemia, elevated anion gap with bicarb 20. WBC 12.7k (PMN-predominant), mild anemia with severe microcytosis/hypochromia and thrombocytopenia at 115k. Lactic acid was normal. LFT's were also normal. CT abdomen/pelvis demonstrated findings consistent with acute cholecystitis with possible perforation, bladder mass without hydronephrosis or urolithiasis. CT head nonacute. General surgery, nephrology, and urology were consulted.  Review of Systems: Denies changes in vision or hearing, headache, cough, sore throat, chest pain, palpitations, shortness of breath, blood in stool, myalgias, arthralgias, rash, and per HPI. All others reviewed and are negative.   Hospital Course:  [facility] are consulted to admit the patient for further evaluation and treatment.  Jenna Stellmach Hookeris a53 y.o.femalewith no regular medical follow up on no medicationswho presented to the ED by EMS7/12when she was found lethargic on a bed of her motel lethargic with RUQ abdominal pain, N/V, no po intake for days, found to be hypotensive treated with IVF. In the ED hypotension had improved to 104/74. She remainedlethargic but oriented,withslurred speech and poor historian, peritoneal signs on  exam.Found to have acute renal failure with imaging confirming acute  cholecystitis due to cholelithiasis in gallbladder neck. Bladder mass was also noted for which urology is consulted.Multiple metabolic derangements indicating acute renal failure with hypokalemia, elevated anion gap with bicarb 20. WBC 12.7k (PMN-predominant), mild anemia with severe microcytosis/hypochromia and thrombocytopenia at 115k. Lactic acid was normal. LFT's were also normal. CT abdomen/pelvis demonstrated findings consistent with acute cholecystitis with possible perforation, bladder mass without hydronephrosis or urolithiasis. CT head nonacute. General surgery, nephrology, and urology were consulted.IV fluids given, though patient remains lethargic. The plan is for interventional radiology to place percutaneous cholecystostomy tube on the afternoon of 08/23/2019.  Urology has been consulted regarding the patient's abnormal renal ultrasound. Urology has determined that the bladder mass is concerning for a urothelial carcinoma. They plan to schedule the patient for an outpatient TURBT once her cholecystitis has been addressed. This morning the patient is much more alert. I also learn this morning that the patient is having difficulty with speech. She states that it is primarily difficulty in getting her words out, but it seems also to be due to difficulty with word finding. Nursing advises me that the patient's boyfriend had reported that this was going on since the night of Friday, August 18 2019. The patient has been sent for a stat CT of the head. I have discussed this with the patient's boyfriend as directed. All questions answered as possible.  The patient has had a CT head and MRI Brain due to lethargy and dysarthria. Both were negative for stroke. I consulted Dr. Rory Percy for this patient. He feels that there is no evidence for stroke. He does state that he thinks that the patient has a metabolic encephalopathy due to her multiple acute illnesses including severe AKI.   The patient will be  discharged to home in fair condition. Her potassium was known to by 2.9 the morning of discharge and oral supplement was ordered. Nursing tells me that only one of the two ordered doses were given to the patient prior to discharge.  Today's assessment: S: The patient is resting comfortably. No new complaints O: Vitals:  Vitals:   08/29/19 0545 08/29/19 1216  BP: 123/78 (!) 146/89  Pulse: 78 64  Resp: 20 20  Temp: 98.7 F (37.1 C) 97.6 F (36.4 C)  SpO2: 94% 98%   Exam:  Constitutional:  . The patient is awake, alert, and oriented x 3. No acute distress. Respiratory:  . No increased work of breathing. . No wheezes, rales, or rhonchi . No tactile fremitus Cardiovascular:  . Regular rate and rhythm . No murmurs, ectopy, or gallups. . No lateral PMI. No thrills. Abdomen:  . Abdomen is soft, non-tender, non-distended . No hernias, masses, or organomegaly . Normoactive bowel sounds.  Musculoskeletal:  . No cyanosis, clubbing, or edema Skin:  . No rashes, lesions, ulcers . palpation of skin: no induration or nodules Neurologic:  . CN 2-12 intact . Sensation all 4 extremities intact Psychiatric:  . Mental status o Mood, affect appropriate o Orientation to person, place, time  . judgment and insight appear intact   Discharge Instructions  Discharge Instructions    Call MD for:  persistant nausea and vomiting   Complete by: As directed    Call MD for:  redness, tenderness, or signs of infection (pain, swelling, redness, odor or green/yellow discharge around incision site)   Complete by: As directed    Call MD for:  severe uncontrolled pain   Complete by:  As directed    Call MD for:  temperature >100.4   Complete by: As directed    Diet - low sodium heart healthy   Complete by: As directed    Discharge instructions   Complete by: As directed    Discharge to home. Follow up with PCP in 7-10 days. No driving until cleared by PCP Follow up with interventional  radiology as directed for drain clinic and cholangiogram. Drain is to be irrigated three times daily with 5 cc normal saline.   Discharge wound care:   Complete by: As directed    As per interventional radiology   Increase activity slowly   Complete by: As directed      Allergies as of 08/29/2019      Reactions   Penicillins Rash   Sulfa Antibiotics Rash      Medication List    TAKE these medications   cephALEXin 500 MG capsule Commonly known as: KEFLEX Take 1 capsule (500 mg total) by mouth 4 (four) times daily.   nicotine 14 mg/24hr patch Commonly known as: NICODERM CQ - dosed in mg/24 hours Place 1 patch (14 mg total) onto the skin daily as needed (nicotine withdrawal).   sodium chloride flush 0.9 % Soln Commonly known as: NS 5 mLs by Intracatheter route every 8 (eight) hours.            Discharge Care Instructions  (From admission, onward)         Start     Ordered   08/29/19 0000  Discharge wound care:       Comments: As per interventional radiology   08/29/19 1213         Allergies  Allergen Reactions  . Penicillins Rash  . Sulfa Antibiotics Rash    The results of significant diagnostics from this hospitalization (including imaging, microbiology, ancillary and laboratory) are listed below for reference.    Significant Diagnostic Studies: CT ABDOMEN PELVIS WO CONTRAST  Result Date: 08/21/2019 CLINICAL DATA:  Abdominal distension.  RIGHT upper quadrant pain. EXAM: CT ABDOMEN AND PELVIS WITHOUT CONTRAST TECHNIQUE: Multidetector CT imaging of the abdomen and pelvis was performed following the standard protocol without IV contrast. COMPARISON:  None FINDINGS: Lower chest: RIGHT basilar atelectasis with air bronchograms. Small RIGHT effusion. Hepatobiliary: The gallbladder is distended and appears inflamed. There is a large gallstone towards the neck of the gallbladder measuring 1.8 cm in diameter. The gallbladder measures 5.2 cm (image 37/2). No IV contrast  makes evaluation of the gallbladder difficult. There is a portion of the gallbladder fundus which extends along the RIGHT hepatic lobe measuring 2 cm on image 35/2. This either represents contained perforation of the gallbladder or potentially a Phrygian cap. Small amount fluid in Morrison's pouch. Pancreas: Pancreas appears normal.  Inflammation. Spleen: Normal spleen Adrenals/urinary tract: Adrenal glands normal. Kidneys and ureters normal. Along the anterior RIGHT aspect of the bladder there is a intraluminal soft tissue density measuring 3.7 x 3.1 cm (image 73/2). Stomach/Bowel: Stomach, small-bowel and cecum are normal. The appendix is not identified but there is no pericecal inflammation to suggest appendicitis. Some secondary inflammation of the hepatic flexure transverse mesocolon favored related to gallbladder pathology. The colon and rectosigmoid colon are normal. Vascular/Lymphatic: Abdominal aorta is normal caliber. No periportal or retroperitoneal adenopathy. No pelvic adenopathy. Reproductive: Uterus normal. 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 No adnexal abnormality Other: No free fluid. Musculoskeletal: No aggressive osseous lesion. IMPRESSION: 1. Findings most consistent with ACUTE CHOLECYSTITIS. Large gallstone in neck of the gallbladder  with distension of the gallbladder and pericholecystic inflammation. Difficult to tell if there is contained perforation of the fundus of the gallbladder or a distended Phrygian cap (noncontrast exam). Favors distended Phrygian cap. 2. Minimal intraperitoneal free fluid. Small amount of fluid in Morison's pouch. 3. RIGHT basilar atelectasis.  Pneumonia less favored. 4. Inflammation of the transverse mesocolon favored related to cholecystitis. 5. 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 Recommend urology consultation. Electronically Signed   By: Suzy Bouchard M.D.   On: 08/21/2019 16:18   CT HEAD WO CONTRAST  Result Date:  08/24/2019 CLINICAL DATA:  Speech difficulty. EXAM: CT HEAD WITHOUT CONTRAST TECHNIQUE: Contiguous axial images were obtained from the base of the skull through the vertex without intravenous contrast. COMPARISON:  CT head 08/21/2019 FINDINGS: Brain: No evidence of acute infarction, hemorrhage, hydrocephalus, extra-axial collection or mass lesion/mass effect. Vascular: Negative for hyperdense vessel Skull: Negative Sinuses/Orbits: Negative Other: None IMPRESSION: Negative CT head. Electronically Signed   By: Franchot Gallo M.D.   On: 08/24/2019 13:31   CT Head Wo Contrast  Result Date: 08/21/2019 CLINICAL DATA:  Altered mental status, unclear cause. Additional history provided: Weakness, lethargy, low blood pressure. EXAM: CT HEAD WITHOUT CONTRAST TECHNIQUE: Contiguous axial images were obtained from the base of the skull through the vertex without intravenous contrast. COMPARISON:  No pertinent prior studies available for comparison. FINDINGS: Brain: Cerebral volume is normal for age. There is no acute intracranial hemorrhage. No demarcated cortical infarct. No extra-axial fluid collection. No evidence of intracranial mass. No midline shift. Vascular: No hyperdense vessel. Skull: Normal. Negative for fracture or focal lesion. Sinuses/Orbits: Visualized orbits show no acute finding. No significant paranasal sinus disease or mastoid effusion at the imaged levels. IMPRESSION: Unremarkable non-contrast CT appearance of the brain for age. No evidence of acute intracranial abnormality. Electronically Signed   By: Kellie Simmering DO   On: 08/21/2019 15:51   MR BRAIN WO CONTRAST  Result Date: 08/25/2019 CLINICAL DATA:  Acute neuro deficit. EXAM: MRI HEAD WITHOUT CONTRAST TECHNIQUE: Multiplanar, multiecho pulse sequences of the brain and surrounding structures were obtained without intravenous contrast. COMPARISON:  CT head 08/24/2019 FINDINGS: Brain: No acute infarction, hemorrhage, hydrocephalus, extra-axial  collection or mass lesion. Vascular: Normal arterial flow voids Skull and upper cervical spine: No focal skeletal lesion. Sinuses/Orbits: Paranasal sinuses clear.  Negative orbit Other: None IMPRESSION: Negative MRI head without contrast. Electronically Signed   By: Franchot Gallo M.D.   On: 08/25/2019 10:59   US RENAL  Result Date: 08/22/2019 CLINICAL DATA:  AKI EXAM: RENAL / URINARY TRACT ULTRASOUND COMPLETE COMPARISON:  None. FINDINGS: Right Kidney: Renal measurements: 12.3 x 5.4 x 6.2 cm = volume: 213 mL . Echogenicity within normal limits. No mass or hydronephrosis visualized. Left Kidney: Renal measurements: 12.9 x 5.7 x 5.3 cm = volume: 204 mL. Echogenicity within normal limits. No mass or hydronephrosis visualized. Bladder: Contracted around Foley catheter. There is an approximately 5.1 x 2.9 x 5.2 cm heterogeneous mass with flow on color Doppler. Other: Incompletely evaluated gallstones, sludge, wall thickening, and pericholecystic fluid reflecting known cholecystitis. IMPRESSION: Contracted bladder.  Approximately 5 cm heterogeneous mass. Electronically Signed   By: Macy Mis M.D.   On: 08/22/2019 12:29   IR Perc Cholecystostomy  Result Date: 08/23/2019 INDICATION: 54 year old female with acute calculus cholecystitis. She is currently too ill to undergo cholecystectomy. EXAM: CHOLECYSTOSTOMY MEDICATIONS: In patient status, currently receiving intravenous antibiotics. ANESTHESIA/SEDATION: Moderate (conscious) sedation was employed during this procedure. A  total of Versed 2 mg and Fentanyl 100 mcg was administered intravenously. Moderate Sedation Time: 10 minutes. The patient's level of consciousness and vital signs were monitored continuously by radiology nursing throughout the procedure under my direct supervision. FLUOROSCOPY TIME:  Fluoroscopy Time: 0 minutes 36 seconds (6 mGy). COMPLICATIONS: None immediate. PROCEDURE: Informed written consent was obtained from the patient after a thorough  discussion of the procedural risks, benefits and alternatives. All questions were addressed. Maximal Sterile Barrier Technique was utilized including caps, mask, sterile gowns, sterile gloves, sterile drape, hand hygiene and skin antiseptic. A timeout was performed prior to the initiation of the procedure. The right upper quadrant was interrogated with ultrasound. The abnormal gallbladder was successfully identified. A suitable skin entry site was selected and marked. Local anesthesia was attained by infiltration with 1% lidocaine. A small dermatotomy was made. Under real-time ultrasound guidance, a 21 gauge Chiba needle was used to puncture the gallbladder after passing through a short transhepatic tract. A 0.018 wire was then coiled in the gallbladder lumen. The needle was exchanged for the Accustick sheath which was advanced over the wire and formed in the gallbladder lumen. Contrast injection confirms the sheath location within the gallbladder. The transitional sheath was then exchanged over a 0.035 Amplatz wire. The skin tract was dilated to 10 [place]. A flex month 10.2 [place] all-purpose drainage catheter was then advanced over the wire and formed. Aspiration yields foul-smelling purulent bile. A sample was sent for Gram stain and culture. The catheter was connected to gravity bag drainage and secured to the skin with 0 Prolene suture. Sterile bandages were placed. IMPRESSION: Successful transhepatic percutaneous cholecystostomy tube placement for an indication of acute calculus cholecystitis. Electronically Signed   By: Jacqulynn Cadet M.D.   On: 08/23/2019 13:19    Microbiology: Recent Results (from the past 240 hour(s))  Blood Culture (routine x 2)     Status: Abnormal   Collection Time: 08/21/19  2:19 PM   Specimen: BLOOD  Result Value Ref Range Status   Specimen Description   Final    BLOOD LEFT ANTECUBITAL Performed at [facility] [address], [place], [place]  [address]    Special Requests   Final    BOTTLES DRAWN AEROBIC AND ANAEROBIC Blood Culture adequate volume Performed at [facility] [address], [place], [place] [address]    Culture  Setup Time   Final    ANAEROBIC BOTTLE ONLY GRAM NEGATIVE RODS CRITICAL RESULT CALLED TO, READ BACK BY AND VERIFIED WITH: PHARMD J GRIMSLEY [id number] AT 600 AM BY CM Performed at [facility], [place] [address], [place], [place] [address]    Culture PROTEUS MIRABILIS (A)  Final   Report Status 08/26/2019 FINAL  Final   Organism ID, Bacteria PROTEUS MIRABILIS  Final      Susceptibility   Proteus mirabilis - MIC*    AMPICILLIN <=2 SENSITIVE Sensitive     CEFAZOLIN <=4 SENSITIVE Sensitive     CEFEPIME <=0.12 SENSITIVE Sensitive     CEFTAZIDIME <=1 SENSITIVE Sensitive     CEFTRIAXONE <=0.25 SENSITIVE Sensitive     CIPROFLOXACIN <=0.25 SENSITIVE Sensitive     GENTAMICIN <=1 SENSITIVE Sensitive     IMIPENEM 2 SENSITIVE Sensitive     TRIMETH/SULFA <=20 SENSITIVE Sensitive     AMPICILLIN/SULBACTAM <=2 SENSITIVE Sensitive     PIP/TAZO <=4 SENSITIVE Sensitive     * PROTEUS MIRABILIS  Blood Culture ID Panel (Reflexed)     Status: Abnormal  Collection Time: 08/21/19  2:19 PM  Result Value Ref Range Status   Enterococcus species NOT DETECTED NOT DETECTED Final   Listeria monocytogenes NOT DETECTED NOT DETECTED Final   Staphylococcus species NOT DETECTED NOT DETECTED Final   Staphylococcus aureus (BCID) NOT DETECTED NOT DETECTED Final   Streptococcus species NOT DETECTED NOT DETECTED Final   Streptococcus agalactiae NOT DETECTED NOT DETECTED Final   Streptococcus pneumoniae NOT DETECTED NOT DETECTED Final   Streptococcus pyogenes NOT DETECTED NOT DETECTED Final   Acinetobacter baumannii NOT DETECTED NOT DETECTED Final   Enterobacteriaceae species DETECTED (A) NOT DETECTED Final    Comment: Enterobacteriaceae represent a large family of gram-negative bacteria, not a single  organism. CRITICAL RESULT CALLED TO, READ BACK BY AND VERIFIED WITH: [place] [id number] AT 600 AM B Y CM    Enterobacter cloacae complex NOT DETECTED NOT DETECTED Final   Escherichia coli NOT DETECTED NOT DETECTED Final   Klebsiella oxytoca NOT DETECTED NOT DETECTED Final   Klebsiella pneumoniae NOT DETECTED NOT DETECTED Final   Proteus species DETECTED (A) NOT DETECTED Final    Comment: CRITICAL RESULT CALLED TO, READ BACK BY AND VERIFIED WITH: PHARMD J GRIMSLEY [id number] AT 600 AM BY CM    Serratia marcescens NOT DETECTED NOT DETECTED Final   Carbapenem resistance NOT DETECTED NOT DETECTED Final   Haemophilus influenzae NOT DETECTED NOT DETECTED Final   Neisseria meningitidis NOT DETECTED NOT DETECTED Final   Pseudomonas aeruginosa NOT DETECTED NOT DETECTED Final   Candida albicans NOT DETECTED NOT DETECTED Final   Candida glabrata NOT DETECTED NOT DETECTED Final   Candida krusei NOT DETECTED NOT DETECTED Final   Candida parapsilosis NOT DETECTED NOT DETECTED Final   Candida tropicalis NOT DETECTED NOT DETECTED Final    Comment: Performed at [facility], [place] [address], [place], [place] [address]  SARS Coronavirus 2 by RT PCR (hospital order, performed in [facility] hospital lab) Nasopharyngeal Nasopharyngeal Swab     Status: None   Collection Time: 08/21/19  4:00 PM   Specimen: Nasopharyngeal Swab  Result Value Ref Range Status   SARS Coronavirus 2 NEGATIVE NEGATIVE Final    Comment: (NOTE) SARS-CoV-2 target nucleic acids are NOT DETECTED.  The SARS-CoV-2 RNA is generally detectable in upper and lower respiratory specimens during the acute phase of infection. The lowest concentration of SARS-CoV-2 viral copies this assay can detect is 250 copies / mL. A negative result does not preclude SARS-CoV-2 infection and should not be used as the sole basis for treatment or other patient management decisions.  A negative result may occur with improper specimen collection /  handling, submission of specimen other than nasopharyngeal swab, presence of viral mutation(s) within the areas targeted by this assay, and inadequate number of viral copies (<250 copies / mL). A negative result must be combined with clinical observations, patient history, and epidemiological information.  Fact Sheet for Patients:   StrictlyIdeas.no  Fact Sheet for Healthcare Providers: BankingDealers.co.za  This test is not yet approved or  cleared by the [place] FDA and has been authorized for detection and/or diagnosis of SARS-CoV-2 by FDA under an Emergency Use Authorization (EUA).  This EUA will remain in effect (meaning this test can be used) for the duration of the COVID-19 declaration under Section 564(b)(1) of the Act, 21 U.S.C. section 360bbb-3(b)(1), unless the authorization is terminated or revoked sooner.  Performed at [facility], [place] [address], [place], [place] [address]   Urine culture  Status: Abnormal   Collection Time: 08/21/19  6:14 PM   Specimen: In/Out Cath Urine  Result Value Ref Range Status   Specimen Description   Final    IN/OUT CATH URINE Performed at [facility], [place] [address] [place], [place] [address]    Special Requests   Final    NONE Performed at [facility], [place] [address] [place], [place] [address]    Culture MULTIPLE SPECIES PRESENT, SUGGEST RECOLLECTION (A)  Final   Report Status 08/23/2019 FINAL  Final  MRSA PCR Screening     Status: None   Collection Time: 08/22/19  8:35 PM   Specimen: Nasal Mucosa; Nasopharyngeal  Result Value Ref Range Status   MRSA by PCR NEGATIVE NEGATIVE Final    Comment:        The GeneXpert MRSA Assay (FDA approved for NASAL specimens only), is one component of a comprehensive MRSA colonization surveillance program. It is not intended to diagnose MRSA infection nor to guide or monitor  treatment for MRSA infections. Performed at [facility], [place] [address], [place], [place] [address]   Aerobic/Anaerobic Culture (surgical/deep wound)     Status: None   Collection Time: 08/23/19  1:00 PM   Specimen: BILE  Result Value Ref Range Status   Specimen Description   Final    BILE Performed at [facility] [address] [place], [place] [address]    Special Requests   Final    Normal Performed at [facility], [place] [address], [place], [place] [address]    Gram Stain   Final    FEW WBC PRESENT,BOTH PMN AND MONONUCLEAR RARE GRAM NEGATIVE RODS    Culture   Final    FEW PROTEUS MIRABILIS NO ANAEROBES ISOLATED Performed at [facility], [place] [address] [place], [place] [address]    Report Status 08/28/2019 FINAL  Final   Organism ID, Bacteria PROTEUS MIRABILIS  Final      Susceptibility   Proteus mirabilis - MIC*    AMPICILLIN <=2 SENSITIVE Sensitive     CEFAZOLIN <=4 SENSITIVE Sensitive     CEFEPIME <=0.12 SENSITIVE Sensitive     CEFTAZIDIME <=1 SENSITIVE Sensitive     CEFTRIAXONE <=0.25 SENSITIVE Sensitive     CIPROFLOXACIN <=0.25 SENSITIVE Sensitive     GENTAMICIN <=1 SENSITIVE Sensitive     IMIPENEM 2 SENSITIVE Sensitive     TRIMETH/SULFA <=20 SENSITIVE Sensitive     AMPICILLIN/SULBACTAM <=2 SENSITIVE Sensitive     PIP/TAZO <=4 SENSITIVE Sensitive     * FEW PROTEUS MIRABILIS  Culture, blood (routine x 2)     Status: None   Collection Time: 08/24/19  1:46 PM   Specimen: BLOOD RIGHT HAND  Result Value Ref Range Status   Specimen Description   Final    BLOOD RIGHT HAND Performed at [facility] [address] [place], [place] [address]    Special Requests   Final    BOTTLES DRAWN AEROBIC ONLY Blood Culture adequate volume Performed at [facility] [address], [place], [place] [address]    Culture   Final    NO GROWTH 5 DAYS Performed at [facility], [place] [address] [place], [place] [address]    Report Status 08/29/2019 FINAL  Final  Culture, blood (routine x 2)     Status: None   Collection Time: 08/24/19  1:46 PM   Specimen: BLOOD RIGHT FOREARM  Result Value Ref Range Status   Specimen Description   Final    BLOOD RIGHT FOREARM Performed at [facility] [address] [place], [place] [address]    Special Requests   Final    BOTTLES DRAWN AEROBIC ONLY Blood Culture adequate volume Performed at [facility] [address], [place], [place] [address]    Culture   Final    NO GROWTH 5 DAYS Performed at [facility], [place] [address], [place], [place] [address]    Report Status 08/29/2019 FINAL  Final     Labs: Basic Metabolic Panel: Recent Labs  Lab 08/25/19 0233 08/26/19 0242 08/27/19 0321 08/28/19 1027 08/29/19 0617  NA 142 142 144 141 143  K 3.4* 3.0* 3.2* 3.0* 2.9*  CL 101 103 106 103 104  CO2 27 28 30 27 29   GLUCOSE 137* 127* 118* 256* 106*  BUN 66* 46* 28* 15 13  CREATININE 3.86* 2.91* 2.00* 1.73* 1.30*  CALCIUM 8.0* 7.7* 7.7* 7.5* 7.6*  MG  --   --   --   --  1.3*   Liver Function Tests: Recent Labs  Lab 08/23/19 0239  AST 20  ALT 17  ALKPHOS 83  BILITOT 0.5  PROT 5.5*  ALBUMIN 1.9*   No results for input(s): LIPASE, AMYLASE in the last 168 hours. No results for input(s): AMMONIA in the last 168 hours. CBC: Recent Labs  Lab 08/23/19 0239 08/24/19 0228  WBC 18.8* 16.7*  NEUTROABS  --  12.6*  HGB 9.5* 8.5*  HCT 28.1* 25.2*  MCV 66.0* 65.5*  PLT 239 300   Cardiac Enzymes: No results for input(s): CKTOTAL, CKMB, CKMBINDEX, TROPONINI in the last 168 hours. BNP: BNP (last 3 results) No results for input(s): BNP in the last 8760 hours.  ProBNP (last 3 results) No results for input(s): PROBNP in the last 8760 hours.  CBG: Recent Labs  Lab 08/28/19 1153 08/28/19 1724 08/28/19 2016 08/29/19 0738 08/29/19 1215  GLUCAP 190* 113* 120*  118* 118*    Principal Problem:   ARF (acute renal failure) (HCC) Active Problems:   Acute calculous cholecystitis   Bladder mass   Increased anion gap metabolic acidosis   Hypokalemia   Time coordinating discharge: 38 minutes  Signed:        Erie Radu, DO [facility]  08/29/2019, 6:58 PM

## 2019-08-30 ENCOUNTER — Other Ambulatory Visit: Payer: Self-pay | Admitting: General Surgery

## 2019-08-30 DIAGNOSIS — K810 Acute cholecystitis: Secondary | ICD-10-CM

## 2019-09-05 LAB — GLUCOSE, CAPILLARY: Glucose-Capillary: 278 mg/dL — ABNORMAL HIGH (ref 70–99)

## 2019-09-20 ENCOUNTER — Other Ambulatory Visit: Payer: Self-pay | Admitting: Urology

## 2019-09-21 MED ORDER — GEMCITABINE CHEMO FOR BLADDER INSTILLATION 2000 MG: 2000.0000 mg | Freq: Once | INTRAVENOUS | Status: AC

## 2019-09-22 NOTE — Progress Notes (Signed)
Pt to cancel surgery, pt given [facility] number

## 2019-10-03 ENCOUNTER — Encounter (HOSPITAL_BASED_OUTPATIENT_CLINIC_OR_DEPARTMENT_OTHER): Payer: Self-pay

## 2019-10-03 ENCOUNTER — Ambulatory Visit (HOSPITAL_BASED_OUTPATIENT_CLINIC_OR_DEPARTMENT_OTHER): Admit: 2019-10-03 | Payer: Medicaid Other | Admitting: Urology

## 2019-10-03 SURGERY — TRANSURETHRAL RESECTION OF BLADDER TUMOR WITH MITOMYCIN-C
Anesthesia: General

## 2019-10-04 ENCOUNTER — Encounter: Payer: Self-pay | Admitting: Radiology

## 2019-10-04 ENCOUNTER — Other Ambulatory Visit (HOSPITAL_COMMUNITY): Payer: Self-pay | Admitting: Interventional Radiology

## 2019-10-04 ENCOUNTER — Ambulatory Visit
Admission: RE | Admit: 2019-10-04 | Discharge: 2019-10-04 | Disposition: A | Discharge status: Home / Self Care | Payer: Medicaid Other | Source: Ambulatory Visit | Attending: General Surgery | Admitting: General Surgery

## 2019-10-04 DIAGNOSIS — K810 Acute cholecystitis: Secondary | ICD-10-CM

## 2019-10-04 HISTORY — PX: IR RADIOLOGIST EVAL & MGMT: IMG5224
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
## 2019-10-18 ENCOUNTER — Other Ambulatory Visit: Payer: Self-pay

## 2019-10-18 ENCOUNTER — Ambulatory Visit (HOSPITAL_COMMUNITY)
Admission: RE | Admit: 2019-10-18 | Discharge: 2019-10-18 | Disposition: A | Discharge status: Home / Self Care | Payer: Medicaid Other | Source: Ambulatory Visit | Attending: Interventional Radiology | Admitting: Interventional Radiology

## 2019-10-18 DIAGNOSIS — Z434 Encounter for attention to other artificial openings of digestive tract: Secondary | ICD-10-CM | POA: Insufficient documentation

## 2019-10-18 DIAGNOSIS — K810 Acute cholecystitis: Secondary | ICD-10-CM | POA: Insufficient documentation

## 2019-10-18 HISTORY — PX: IR EXCHANGE BILIARY DRAIN: IMG6046

## 2019-10-18 MED ORDER — LIDOCAINE HCL 2 % IJ SOLN
INTRAMUSCULAR | Status: AC | PRN
  Administered 2019-10-18: 3 mL

## 2019-10-18 MED ORDER — LIDOCAINE HCL 1 % IJ SOLN
INTRAMUSCULAR | Status: AC
  Filled 2019-10-18: qty 20

## 2019-10-18 MED ORDER — IOHEXOL 300 MG/ML  SOLN
50.0000 mL | Freq: Once | INTRAMUSCULAR | Status: AC | PRN
  Administered 2019-10-18: 15 mL

## 2019-10-19 ENCOUNTER — Other Ambulatory Visit: Payer: Self-pay | Admitting: Student

## 2019-10-19 DIAGNOSIS — K8000 Calculus of gallbladder with acute cholecystitis without obstruction: Secondary | ICD-10-CM

## 2019-12-13 ENCOUNTER — Inpatient Hospital Stay (HOSPITAL_COMMUNITY): Admission: RE | Admit: 2019-12-13 | Payer: Medicaid Other | Source: Ambulatory Visit

## 2021-09-11 IMAGING — CT CT HEAD W/O CM
3 series · 16 of 46 positions shown, 19 images · non-contrast
Comparison: No pertinent prior studies available for comparison.

CLINICAL DATA: Altered mental status, unclear cause. Additional
history provided: Weakness, lethargy, low blood pressure.

EXAM:
CT HEAD WITHOUT CONTRAST
TECHNIQUE: Contiguous axial images were obtained from the base of the skull
through the vertex without intravenous contrast.

[Series 2: head wo · axial · 0.39mm/px · z∈[+1407,+1527]mm · 10 of 29 slices shown, 13 images]
[im 3/29  brain]
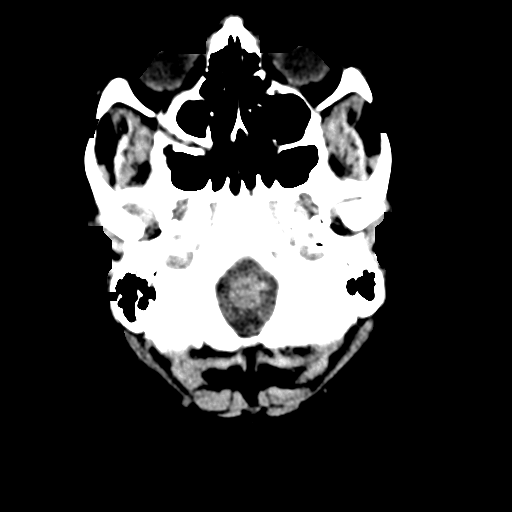
[im 3/29  bone]
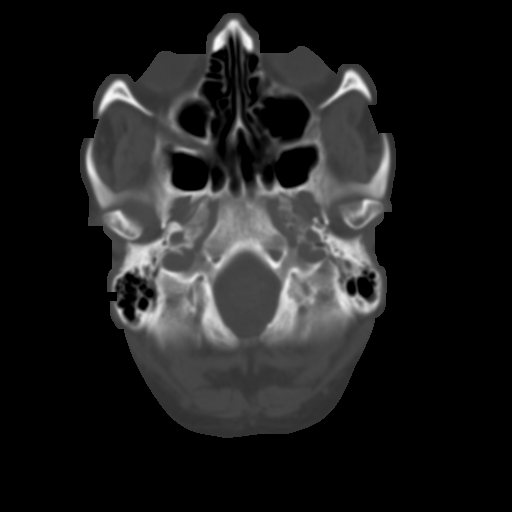
[im 6/29  brain]
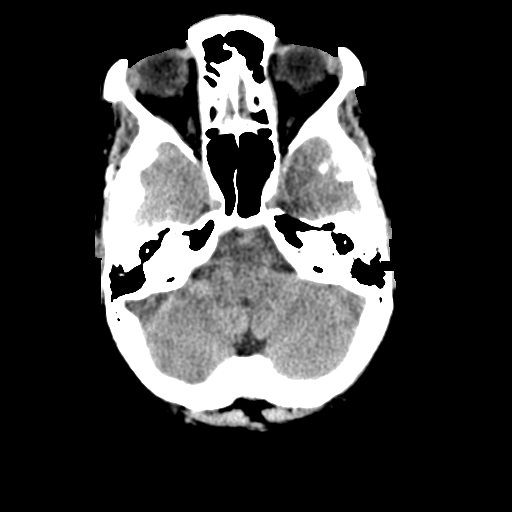
[im 8/29  brain]
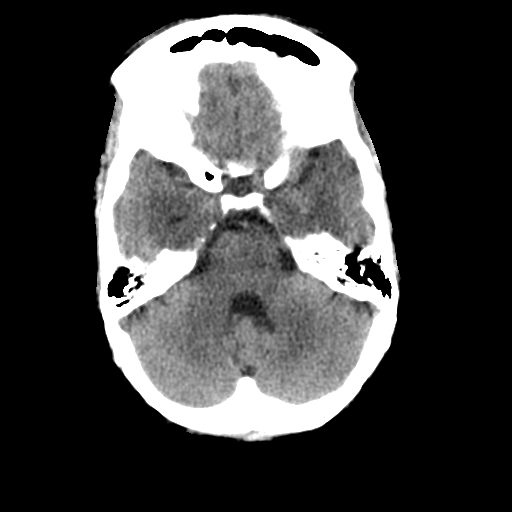
[im 11/29  brain]
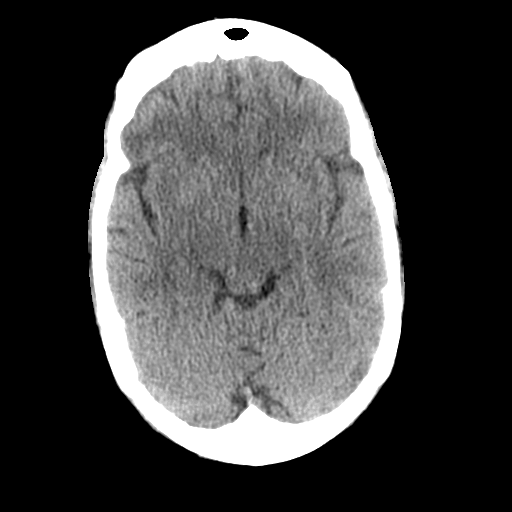
[im 14/29  brain]
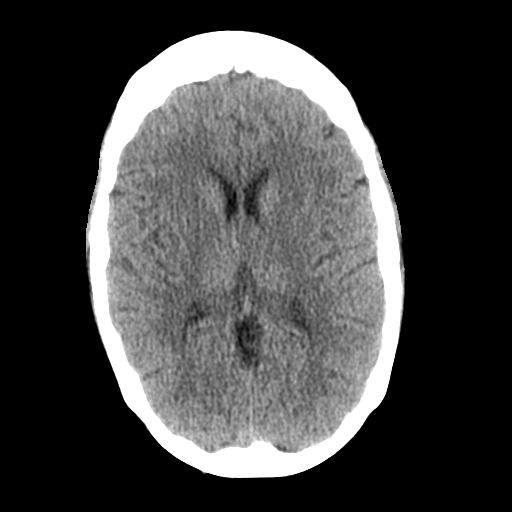
[im 14/29  bone]
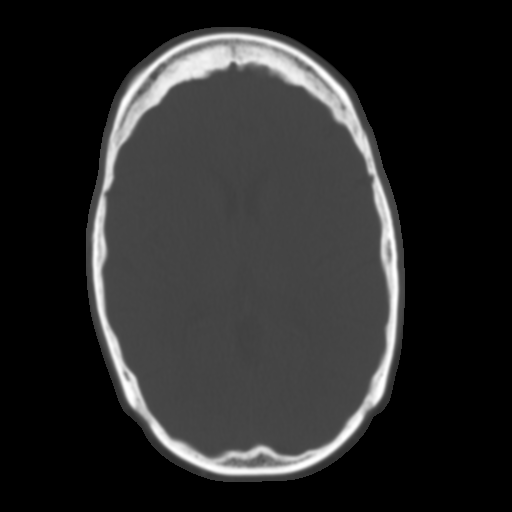
[im 16/29  brain]
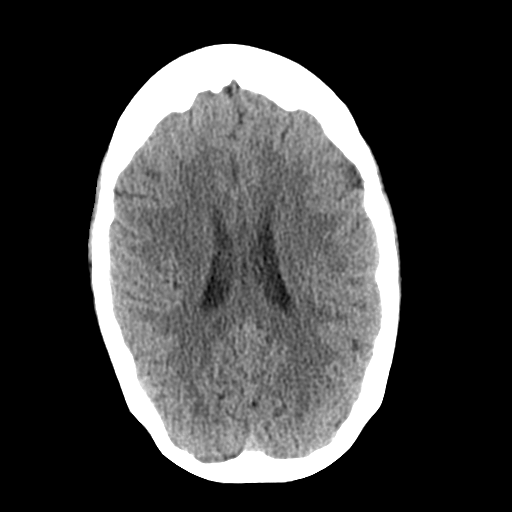
[im 19/29  brain]
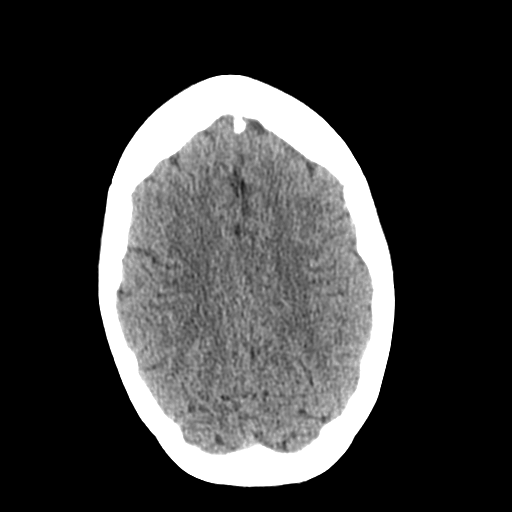
[im 22/29  brain]
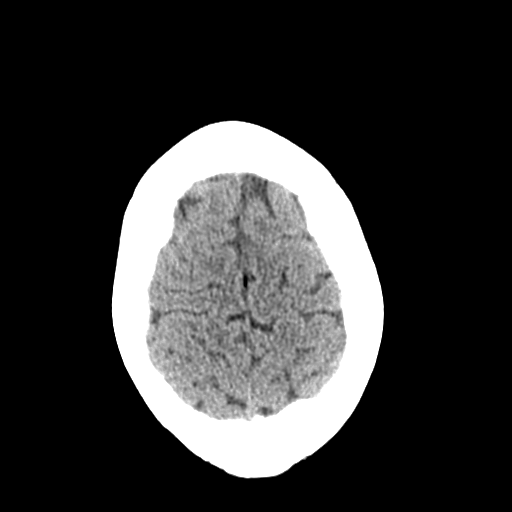
[im 24/29  brain]
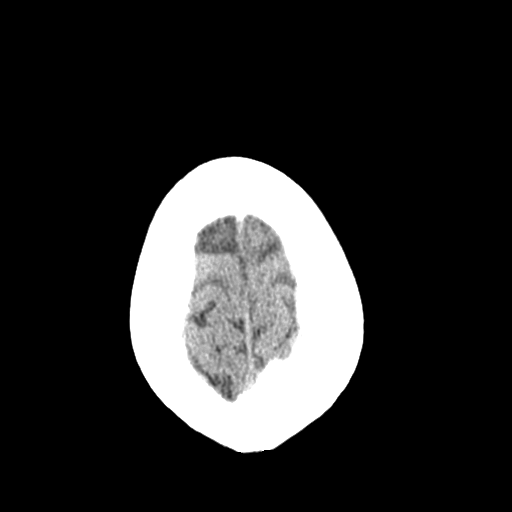
[im 24/29  bone]
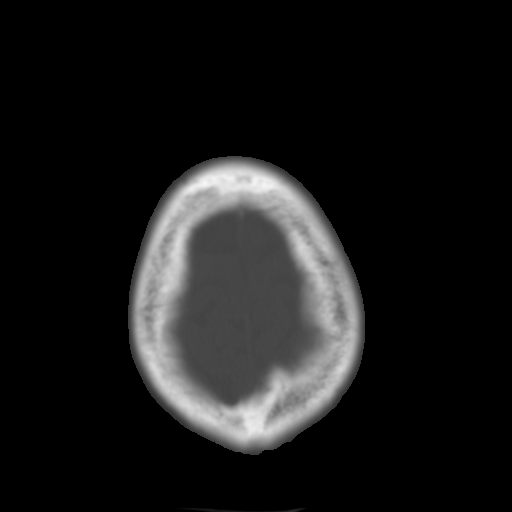
[im 27/29  brain]
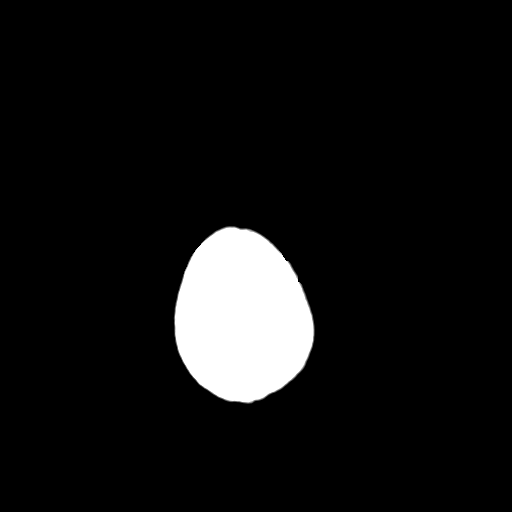

[Series 5: coronal soft tissue · coronal · 0.26mm/px · 3 of 63 slices shown]
[im 21/63  brain]
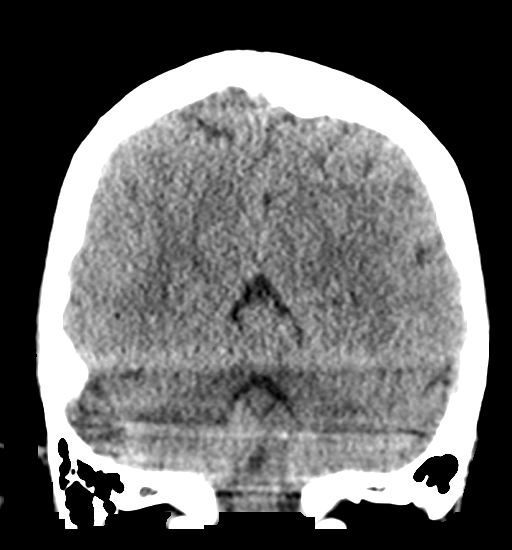
[im 28/63  brain]
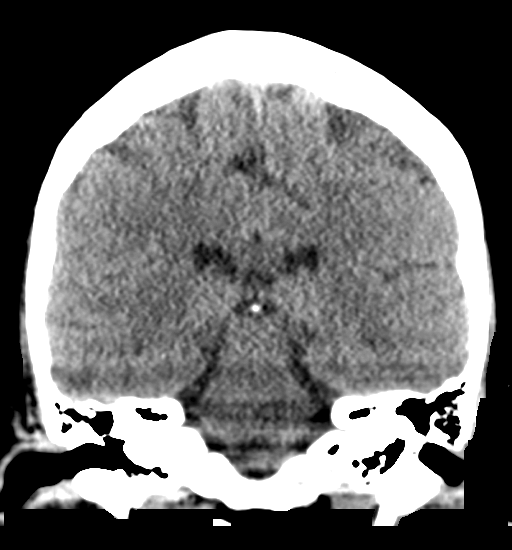
[im 35/63  brain]
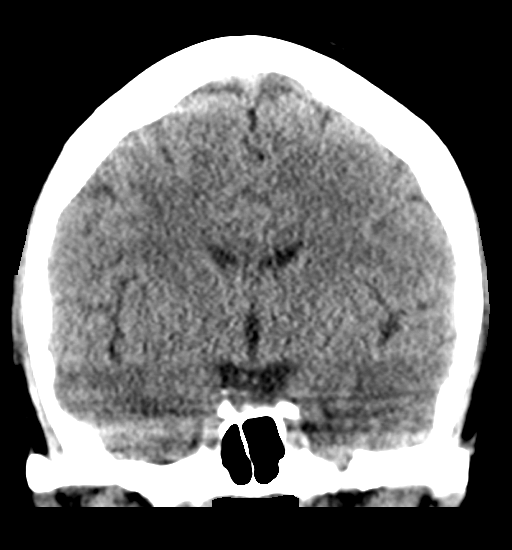

[Series 6: sagittal soft tissue · sagittal · 0.28mm/px · 3 of 43 slices shown]
[im 15/43  brain]
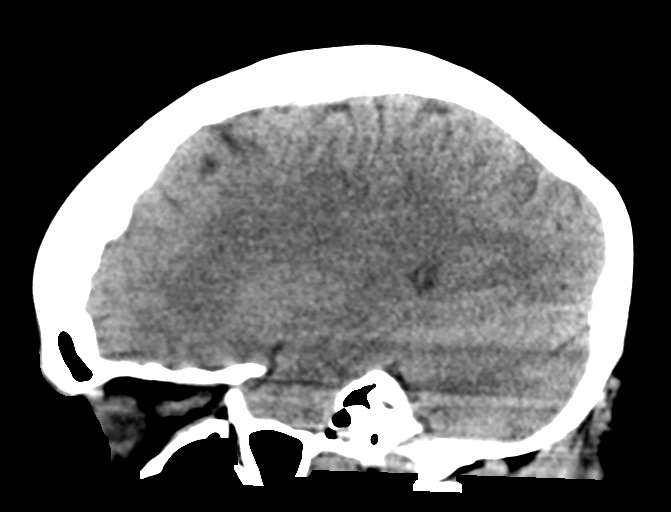
[im 22/43  brain]
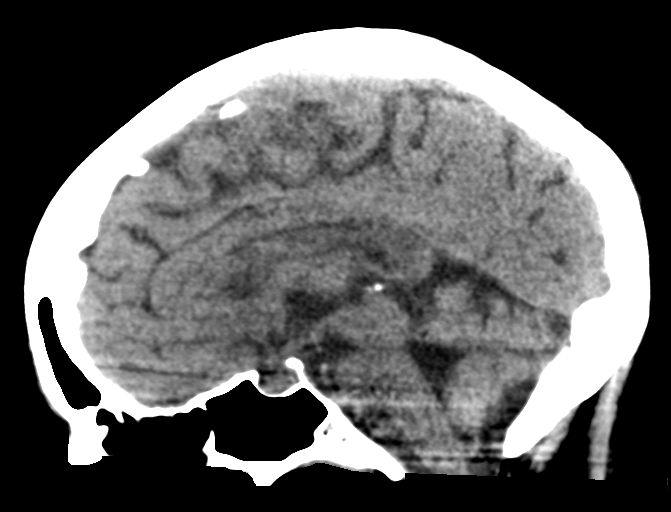
[im 29/43  brain]
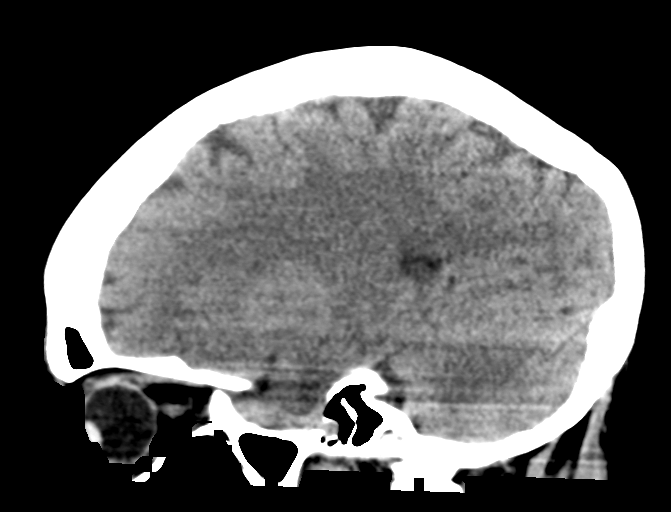

[16 of 46 positions shown; findings below may reference images not displayed]

FINDINGS: Brain:

Cerebral volume is normal for age.

There is no acute intracranial hemorrhage.

No demarcated cortical infarct.

No extra-axial fluid collection.

No evidence of intracranial mass.

No midline shift.

Vascular: No hyperdense vessel.

Skull: Normal. Negative for fracture or focal lesion.

Sinuses/Orbits: Visualized orbits show no acute finding. No
significant paranasal sinus disease or mastoid effusion at the
imaged levels.
IMPRESSION: Unremarkable non-contrast CT appearance of the brain for age. No
evidence of acute intracranial abnormality.

## 2022-11-10 DIAGNOSIS — Z8719 Personal history of other diseases of the digestive system: Secondary | ICD-10-CM | POA: Insufficient documentation

## 2022-11-10 DIAGNOSIS — Z8659 Personal history of other mental and behavioral disorders: Secondary | ICD-10-CM | POA: Insufficient documentation

## 2022-11-10 DIAGNOSIS — M19041 Primary osteoarthritis, right hand: Secondary | ICD-10-CM | POA: Insufficient documentation

## 2022-12-08 DIAGNOSIS — M8580 Other specified disorders of bone density and structure, unspecified site: Secondary | ICD-10-CM | POA: Insufficient documentation

## 2023-02-02 DIAGNOSIS — F331 Major depressive disorder, recurrent, moderate: Secondary | ICD-10-CM | POA: Insufficient documentation

## 2023-02-02 DIAGNOSIS — F411 Generalized anxiety disorder: Secondary | ICD-10-CM | POA: Insufficient documentation

## 2023-02-06 DIAGNOSIS — Z170 Estrogen receptor positive status [ER+]: Secondary | ICD-10-CM | POA: Insufficient documentation

## 2023-02-06 DIAGNOSIS — C50211 Malignant neoplasm of upper-inner quadrant of right female breast: Secondary | ICD-10-CM | POA: Insufficient documentation

## 2023-02-26 ENCOUNTER — Encounter: Payer: Self-pay | Admitting: *Deleted

## 2023-02-26 NOTE — Progress Notes (Signed)
Patient looking for a second opinion. She has a new diagnosis of breast cancer, but also being worked up for bladder cancer.   Reached out to Chase Picket to introduce myself as the office RN Navigator and explain our new patient process. Patient's sister, Jenna [name] manages her [name]. Reviewed the reason for their referral and scheduled their new patient appointment along with labs. Provided address and directions to the office including call back phone number. Reviewed with patient any concerns they may have or any possible barriers to attending their appointment.   Informed patient about my role as a navigator and that I will meet with them prior to their New Patient appointment and more fully discuss what services I can provide. At this time patient has no further questions or needs.    Sooner appointment time offered but due to work schedules, the appointment on 03/15/2023 was preferred.   Oncology Nurse Navigator Documentation     02/26/2023    2:30 PM  Oncology Nurse Navigator Flowsheets  Navigator Follow Up Date: 03/15/2023  Navigator Follow Up Reason: New Patient Appointment  Navigator Location [facility]  Navigator Encounter Type Introductory Phone Call  Patient Visit Type MedOnc  Treatment Phase Pre-Tx/Tx Discussion  Barriers/Navigation Needs Coordination of [name];Education  Interventions Coordination of [name];Education  Acuity Level 2-Minimal Needs (1-2 Barriers Identified)  Coordination of [name] Appts  Education Method Verbal  Support Groups/Services Friends and Family  Time Spent with Patient 30

## 2023-03-15 ENCOUNTER — Other Ambulatory Visit: Payer: Self-pay

## 2023-03-15 ENCOUNTER — Inpatient Hospital Stay (HOSPITAL_BASED_OUTPATIENT_CLINIC_OR_DEPARTMENT_OTHER): Payer: BLUE CROSS/BLUE SHIELD | Admitting: Hematology & Oncology

## 2023-03-15 ENCOUNTER — Inpatient Hospital Stay: Payer: BLUE CROSS/BLUE SHIELD | Attending: Hematology & Oncology

## 2023-03-15 VITALS — BP 107/65 | HR 46 | Temp 97.9°F | Resp 16 | Ht 64.0 in | Wt 134.0 lb

## 2023-03-15 DIAGNOSIS — C773 Secondary and unspecified malignant neoplasm of axilla and upper limb lymph nodes: Secondary | ICD-10-CM | POA: Diagnosis not present

## 2023-03-15 DIAGNOSIS — Z803 Family history of malignant neoplasm of breast: Secondary | ICD-10-CM | POA: Diagnosis not present

## 2023-03-15 DIAGNOSIS — F17210 Nicotine dependence, cigarettes, uncomplicated: Secondary | ICD-10-CM

## 2023-03-15 DIAGNOSIS — Z170 Estrogen receptor positive status [ER+]: Secondary | ICD-10-CM

## 2023-03-15 DIAGNOSIS — C50211 Malignant neoplasm of upper-inner quadrant of right female breast: Secondary | ICD-10-CM | POA: Diagnosis present

## 2023-03-15 DIAGNOSIS — C50911 Malignant neoplasm of unspecified site of right female breast: Secondary | ICD-10-CM

## 2023-03-15 LAB — CBC WITH DIFFERENTIAL (CANCER CENTER ONLY)
Abs Immature Granulocytes: 0.01 10*3/uL (ref 0.00–0.07)
Basophils Absolute: 0.1 10*3/uL (ref 0.0–0.1)
Basophils Relative: 1 %
Eosinophils Absolute: 0.1 10*3/uL (ref 0.0–0.5)
Eosinophils Relative: 1 %
HCT: 39.2 % (ref 36.0–46.0)
Hemoglobin: 11.9 g/dL — ABNORMAL LOW (ref 12.0–15.0)
Immature Granulocytes: 0 %
Lymphocytes Relative: 44 %
Lymphs Abs: 3.2 10*3/uL (ref 0.7–4.0)
MCH: 22.1 pg — ABNORMAL LOW (ref 26.0–34.0)
MCHC: 30.4 g/dL (ref 30.0–36.0)
MCV: 72.9 fL — ABNORMAL LOW (ref 80.0–100.0)
Monocytes Absolute: 0.5 10*3/uL (ref 0.1–1.0)
Monocytes Relative: 7 %
Neutro Abs: 3.3 10*3/uL (ref 1.7–7.7)
Neutrophils Relative %: 47 %
Platelet Count: 258 10*3/uL (ref 150–400)
RBC: 5.38 MIL/uL — ABNORMAL HIGH (ref 3.87–5.11)
RDW: 14.9 % (ref 11.5–15.5)
WBC Count: 7.2 10*3/uL (ref 4.0–10.5)
nRBC: 0 % (ref 0.0–0.2)

## 2023-03-15 LAB — CMP (CANCER CENTER ONLY)
ALT: 12 U/L (ref 0–44)
AST: 11 U/L — ABNORMAL LOW (ref 15–41)
Albumin: 4 g/dL (ref 3.5–5.0)
Alkaline Phosphatase: 63 U/L (ref 38–126)
Anion gap: 6 (ref 5–15)
BUN: 14 mg/dL (ref 6–20)
CO2: 30 mmol/L (ref 22–32)
Calcium: 9.4 mg/dL (ref 8.9–10.3)
Chloride: 104 mmol/L (ref 98–111)
Creatinine: 0.83 mg/dL (ref 0.44–1.00)
GFR, Estimated: >60 mL/min (ref >60)
Glucose, Bld: 92 mg/dL (ref 70–99)
Potassium: 4.2 mmol/L (ref 3.5–5.1)
Sodium: 140 mmol/L (ref 135–145)
Total Bilirubin: 0.5 mg/dL (ref 0.0–1.2)
Total Protein: 6.8 g/dL (ref 6.5–8.1)

## 2023-03-15 LAB — LACTATE DEHYDROGENASE: LDH: 153 U/L (ref 98–192)
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
## 2023-03-16 ENCOUNTER — Encounter: Payer: Self-pay | Admitting: *Deleted

## 2023-03-16 ENCOUNTER — Telehealth: Payer: Self-pay

## 2023-03-16 LAB — CANCER ANTIGEN 27.29: CA 27.29: 12.4 U/mL (ref 0.0–38.6)

## 2023-03-16 NOTE — Telephone Encounter (Signed)
Clinical Social Work was referred by Dr. Myna Hidalgo for assessment of psychosocial needs.  CSW attempted to contact patient by phone.  Could not leave a voicemail.

## 2023-03-16 NOTE — Progress Notes (Signed)
 This navigator was out of the office for New Patient Appointment. Patient came in for second opinion for a recently diagnosed R breast cancer ER/PR+ HER2- Stage II. She is also undergoing workup for a bladder mass with a TURB scheduled for 05/04/2023.  Dr Timmy was able to get her an appointment with Dr Moira on 03/29/2023. At this time patient is not certain that she wants her care here.   Will follow her through complete staging and surgery.   Oncology Nurse Navigator Documentation     03/16/2023    1:00 PM  Oncology Nurse Navigator Flowsheets  Abnormal Finding Date 12/08/2022  Confirmed Diagnosis Date 01/18/2023  Diagnosis Status Confirmed Diagnosis Complete  Navigator Follow Up Date: 03/29/2023  Navigator Follow Up Reason: Review Note  Navigator Location [facility]  Navigator Encounter Type Appt/Treatment Plan Review  Patient Visit Type MedOnc  Treatment Phase Pre-Tx/Tx Discussion  Barriers/Navigation Needs Coordination of Care;Education  Interventions None Required  Acuity Level 2-Minimal Needs (1-2 Barriers Identified)  Support Groups/Services Friends and Family  Time Spent with Patient 30

## 2023-03-30 ENCOUNTER — Encounter: Payer: Self-pay | Admitting: *Deleted

## 2023-03-30 NOTE — Progress Notes (Signed)
 Patient seen by Dr Jennet Maduro at [facility] and was in agreement to proceed with a lumpectomy. She is scheduled for this Thursday.   Oncology Nurse Navigator Documentation     03/30/2023    9:30 AM  Oncology Nurse Navigator Flowsheets  Navigator Follow Up Date: 04/01/2023  Navigator Follow Up Reason: Surgery  Navigator Location [facility]  Navigator Encounter Type Appt/Treatment Plan Review  Patient Visit Type MedOnc  Treatment Phase Pre-Tx/Tx Discussion  Barriers/Navigation Needs Coordination of Care;Education  Interventions None Required  Acuity Level 2-Minimal Needs (1-2 Barriers Identified)  Support Groups/Services Friends and Family  Time Spent with Patient 15

## 2023-04-01 ENCOUNTER — Encounter: Payer: Self-pay | Admitting: *Deleted

## 2023-04-01 NOTE — Progress Notes (Signed)
 Patient had her R breast lumpectomy with [facility] today at [facility]. Will follow for pathology.   Oncology Nurse Navigator Documentation     04/01/2023    3:30 PM  Oncology Nurse Navigator Flowsheets  Phase of Treatment Surgery  Surgery Actual Start Date: 04/01/2023  Navigator Follow Up Date: 04/06/2023  Navigator Follow Up Reason: Pathology  Navigator Location [facility]  Navigator Encounter Type Appt/Treatment Plan Review  Treatment Initiated Date 04/01/2023  Patient Visit Type MedOnc  Treatment Phase Active Tx  Barriers/Navigation Needs Coordination of Care;Education  Interventions None Required  Acuity Level 2-Minimal Needs (1-2 Barriers Identified)  Support Groups/Services Friends and Family  Time Spent with Patient 15

## 2023-04-09 ENCOUNTER — Encounter: Payer: Self-pay | Admitting: *Deleted

## 2023-04-09 NOTE — Progress Notes (Signed)
 Reviewed patient's path with Dr Myna Hidalgo. Right Lumpectomy with positive margins and 9/15 LN positive. ER/PR+ HER2-  Dr Myna Hidalgo would like to see the patient in follow up in 2 weeks. Message sent to scheduling.   Per Dr Myna Hidalgo, [employer] testing request sent on specimen [id number] DOS 04/01/2023 performed at [facility].   Oncology Nurse Navigator Documentation     04/09/2023   11:30 AM  Oncology Nurse Navigator Flowsheets  Navigator Follow Up Date: 04/27/2023  Navigator Follow Up Reason: Follow-up Appointment  Navigator Location [facility]  Navigator Encounter Type Pathology Review;Molecular Studies  Patient Visit Type MedOnc  Treatment Phase Active Tx  Barriers/Navigation Needs Coordination of Care;Education  Interventions Coordination of Care  Acuity Level 2-Minimal Needs (1-2 Barriers Identified)  Coordination of Care Appts;Pathology  Support Groups/Services Friends and Family  Time Spent with Patient 30

## 2023-04-12 ENCOUNTER — Telehealth: Payer: Self-pay | Admitting: Hematology & Oncology

## 2023-04-12 NOTE — Telephone Encounter (Signed)
 Called to schedule 60 min follow up w/ Dr Myna Hidalgo per Asher Muir. Unable to LVM for scheduling.

## 2023-04-19 ENCOUNTER — Telehealth: Payer: Self-pay | Admitting: Hematology & Oncology

## 2023-04-19 NOTE — Telephone Encounter (Signed)
 Called to schedule follow up per Dr Myna Hidalgo. Unable to LVM for scheduling.

## 2023-04-22 ENCOUNTER — Telehealth: Payer: Self-pay | Admitting: Hematology & Oncology

## 2023-04-22 NOTE — Telephone Encounter (Signed)
 Called ot schedule 60 min follow up appt. Unable to LVM to return call for scheduling.

## 2023-04-26 ENCOUNTER — Other Ambulatory Visit: Payer: Self-pay | Admitting: *Deleted

## 2023-04-26 DIAGNOSIS — C50911 Malignant neoplasm of unspecified site of right female breast: Secondary | ICD-10-CM

## 2023-04-27 ENCOUNTER — Inpatient Hospital Stay

## 2023-04-27 ENCOUNTER — Inpatient Hospital Stay: Admitting: Hematology & Oncology

## 2023-04-28 ENCOUNTER — Encounter: Payer: Self-pay | Admitting: *Deleted

## 2023-04-28 NOTE — Progress Notes (Signed)
 Patient has decided to keep her care in the [facility]. All appointments in this office cancelled. Will discontinue navigation, but be available to the patient as needed in the future.   Oncology Nurse Navigator Documentation     04/28/2023   11:15 AM  Oncology Nurse Navigator Flowsheets  Navigation Complete Date: 04/28/2023  Post Navigation: Continue to Follow Patient? No  Reason Not Navigating Patient: Seeking Care elsewhere  Navigator Location [facility]  Navigator Encounter Type Appt/Treatment Plan Review  Patient Visit Type MedOnc  Time Spent with Patient 15
